# Patient Record
Sex: Male | Born: 1985 | Race: Black or African American | Hispanic: No | Marital: Single | Smoking: Current some day smoker
Health system: Southern US, Community
[De-identification: ages and names within clinical notes are randomized; demographics above are authoritative.]

## PROBLEM LIST (undated history)

## (undated) ENCOUNTER — Ambulatory Visit: Admission: EM

## (undated) ENCOUNTER — Ambulatory Visit

## (undated) DIAGNOSIS — T7840XA Allergy, unspecified, initial encounter: Secondary | ICD-10-CM

## (undated) DIAGNOSIS — K5190 Ulcerative colitis, unspecified, without complications: Secondary | ICD-10-CM

## (undated) DIAGNOSIS — I10 Essential (primary) hypertension: Secondary | ICD-10-CM

## (undated) HISTORY — DX: Allergy, unspecified, initial encounter: T78.40XA

---

## 2011-10-11 ENCOUNTER — Emergency Department (HOSPITAL_COMMUNITY)
Admission: EM | Admit: 2011-10-11 | Discharge: 2011-10-11 | Disposition: A | Discharge status: Home / Self Care | Payer: Self-pay | Source: Home / Self Care | Attending: Family Medicine | Admitting: Family Medicine

## 2011-10-11 ENCOUNTER — Encounter (HOSPITAL_COMMUNITY): Payer: Self-pay

## 2011-10-11 DIAGNOSIS — S43429A Sprain of unspecified rotator cuff capsule, initial encounter: Secondary | ICD-10-CM

## 2011-10-11 DIAGNOSIS — S46019A Strain of muscle(s) and tendon(s) of the rotator cuff of unspecified shoulder, initial encounter: Secondary | ICD-10-CM

## 2011-10-11 MED ORDER — NAPROXEN 500 MG PO TABS: 500.0000 mg | ORAL_TABLET | Freq: Two times a day (BID) | ORAL | Status: DC

## 2011-10-11 MED ORDER — HYDROCODONE-ACETAMINOPHEN 5-325 MG PO TABS: ORAL_TABLET | ORAL | Status: AC

## 2011-10-11 NOTE — Discharge Instructions (Signed)
Your exam is concerning for a rotator cuff tendinopathy, usually caused by repetitive or overuse activity. Take the medications as directed. Please call the Orthopaedic Provider listed on your discharge papers for the next available appointment as your shoulder might require further evaluation and/or treatment by a shoulder surgeon. Return to care should your symptoms not improve, or worsen in any way, such as numbness, tingling, or weakness, or pain worsens in any way.

## 2011-10-11 NOTE — ED Notes (Signed)
C/o 1 week duraiton of pain numbness left shoulder, worse w movement; denies trauma

## 2011-10-11 NOTE — ED Provider Notes (Signed)
History     CSN: [id number]  Arrival date & time 10/11/11  1608   First MD Initiated Contact with Patient 10/11/11 1722      Chief Complaint  Patient presents with  . Arm Pain    (Consider location/radiation/quality/duration/timing/severity/associated sxs/prior treatment) HPI Comments: Andre Gibbs presents for evaluation of left shoulder pain for the last week. He denies any specific injury, but does report, that he does a lot of lifting of heavy objects, for his job. 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 He denies any numbness, tingling, or weakness down his arm.  Patient is a 26 y.o. male presenting with shoulder pain. The history is provided by the patient.  Shoulder Pain This is a new problem. The current episode started more than 2 days ago. The problem occurs constantly. The problem has not changed since onset.The symptoms are aggravated by exertion. The symptoms are relieved by nothing. He has tried nothing for the symptoms.    History reviewed. No pertinent past medical history.  History reviewed. No pertinent past surgical history.  History reviewed. No pertinent family history.  History  Substance Use Topics  . Smoking status: Not on file  . Smokeless tobacco: Not on file  . Alcohol Use: Not on file      Review of Systems  Constitutional: Negative.   HENT: Negative.   Eyes: Negative.   Respiratory: Negative.   Cardiovascular: Negative.   Gastrointestinal: Negative.   Genitourinary: Negative.   Musculoskeletal: Positive for arthralgias.  Skin: Negative.   Neurological: Negative.     Allergies  Review of patient's allergies indicates no known allergies.  Home Medications   Current Outpatient Rx  Name Route Sig Dispense Refill  . HYDROCODONE-ACETAMINOPHEN 5-325 MG PO TABS  Take one to two tablets every 4 to 6 hours as needed for pain 20 tablet 0  . NAPROXEN 500 MG PO TABS Oral Take 1 tablet (500 mg total) by mouth 2 (two)  times daily. 30 tablet 0    BP 131/82  Pulse 59  Temp 98.2 F (36.8 C)  Resp 17  SpO2 98%  Physical Exam  Nursing note and vitals reviewed. Constitutional: He is oriented to person, place, and time. He appears well-developed and well-nourished.  HENT:  Head: Normocephalic and atraumatic.  Eyes: EOM are normal.  Neck: Normal range of motion.  Pulmonary/Chest: Effort normal.  Musculoskeletal:       Left shoulder: He exhibits decreased range of motion, tenderness and pain. He exhibits no bony tenderness, no swelling, no deformity, no laceration, normal pulse and normal strength.       LEFT shoulder: decreased abduction to 80 degrees, normal adduction, decreased flexion to 80 degrees, and decreased extension; 5/5 strength with internal and external rotation but pain elicited motion; 5/5 grip strength; positive Hawkin's, positive Ober's, negative Neer's, negative Speed's test  Neurological: He is alert and oriented to person, place, and time.  Skin: Skin is warm and dry.  Psychiatric: His behavior is normal.    ED Course  Procedures (including critical care time)  Labs Reviewed - No data to display No results found.   1. Rotator cuff strain       MDM  Given rx for naproxen and hydrocodone; follow up with Orthopaedic provider on call.        Renaee Munda, MD 10/11/11 [phone]

## 2011-12-06 ENCOUNTER — Emergency Department (HOSPITAL_COMMUNITY)
Admission: EM | Admit: 2011-12-06 | Discharge: 2011-12-06 | Disposition: A | Discharge status: Home / Self Care | Payer: Self-pay | Attending: Emergency Medicine | Admitting: Emergency Medicine

## 2011-12-06 ENCOUNTER — Encounter (HOSPITAL_COMMUNITY): Payer: Self-pay | Admitting: Emergency Medicine

## 2011-12-06 DIAGNOSIS — R110 Nausea: Secondary | ICD-10-CM | POA: Insufficient documentation

## 2011-12-06 DIAGNOSIS — R519 Headache, unspecified: Secondary | ICD-10-CM

## 2011-12-06 DIAGNOSIS — R51 Headache: Secondary | ICD-10-CM | POA: Insufficient documentation

## 2011-12-06 MED ORDER — NAPROXEN 500 MG PO TABS
500.0000 mg | ORAL_TABLET | Freq: Once | ORAL | Status: DC
Start: 2011-12-06 — End: 2011-12-06
  Filled 2011-12-06: qty 1

## 2011-12-06 MED ORDER — NAPROXEN 500 MG PO TABS: 500.0000 mg | ORAL_TABLET | Freq: Two times a day (BID) | ORAL | Status: DC

## 2011-12-06 NOTE — ED Notes (Signed)
PA at bedside.

## 2011-12-06 NOTE — ED Notes (Signed)
PT. REPORTS HEADACHE , DIZZINESS , CHILLS AND BODY ACHES ONSET LAST WEEK .

## 2011-12-06 NOTE — ED Provider Notes (Signed)
History     CSN: [id number]  Arrival date & time 12/06/11  0530   First MD Initiated Contact with Patient 12/06/11 0542      Chief Complaint  Patient presents with  . Headache    (Consider location/radiation/quality/duration/timing/severity/associated sxs/prior treatment) HPI Comments: Patient with no significant past medical history presents emergency department with chief complaint of headache.  Onset of symptoms began Thursday while at work.  Patient developed an acute onset of headache, dizziness, chills, blurred vision and diaphoresis.  Patient was sent home at that time and symptoms lasted for 3-4 hours and then resolved.  Patient was at baseline over the weekend and then developed symptoms again on Monday.  Headache has been intermittent, lasting hours, located primarily in the temporal and occipital regions, pain does not radiate, severity 8/10 at worst and now patient is pain-free. No hx of trauma, LOC, CP, SOB, persistent vision changes.   Patient is a 26 y.o. male presenting with headaches. The history is provided by the patient.  Headache  Associated symptoms include nausea. Pertinent negatives include no fever, no shortness of breath and no vomiting.    History reviewed. No pertinent past medical history.  History reviewed. No pertinent past surgical history.  No family history on file.  History  Substance Use Topics  . Smoking status: Current Everyday Smoker  . Smokeless tobacco: Not on file  . Alcohol Use: Yes      Review of Systems  Constitutional: Positive for chills, diaphoresis and activity change. Negative for fever and fatigue.  HENT: Negative for ear pain, congestion, facial swelling, neck pain, neck stiffness, sinus pressure and tinnitus.   Eyes: Negative for photophobia, redness and visual disturbance.  Respiratory: Negative for cough, shortness of breath, wheezing and stridor.   Cardiovascular: Negative for chest pain.  Gastrointestinal: Positive  for nausea. Negative for vomiting and abdominal pain.  Musculoskeletal: Negative for myalgias and gait problem.  Skin: Negative for rash.  Neurological: Positive for headaches. Negative for dizziness, syncope, speech difficulty, weakness, light-headedness and numbness.       No bowel or bladder incontinence.  Psychiatric/Behavioral: Negative for confusion.  All other systems reviewed and are negative.    Allergies  Review of patient's allergies indicates no known allergies.  Home Medications   Current Outpatient Rx  Name Route Sig Dispense Refill  . ASPIRIN-ACETAMINOPHEN-CAFFEINE 250-250-65 MG PO TABS Oral Take 1 tablet by mouth every 6 (six) hours as needed. For pain      BP 145/88  Pulse 82  Temp(Src) 100 F (37.8 C) (Oral)  Resp 20  SpO2 96%  Physical Exam  Nursing note and vitals reviewed. Constitutional: He is oriented to person, place, and time. He appears well-developed and well-nourished. No distress.  HENT:  Head: Normocephalic and atraumatic.  Right Ear: External ear normal.  Left Ear: External ear normal.  Eyes: Conjunctivae and EOM are normal. Pupils are equal, round, and reactive to light. Right eye exhibits no discharge. Left eye exhibits no discharge. Right conjunctiva is not injected. Right conjunctiva has no hemorrhage. Left conjunctiva is not injected. Left conjunctiva has no hemorrhage. No scleral icterus. Right eye exhibits no nystagmus. Left eye exhibits no nystagmus.  Neck: Normal range of motion and full passive range of motion without pain. Neck supple. No JVD present. No spinous process tenderness present. Carotid bruit is not present. No rigidity. No Brudzinski's sign noted.  Cardiovascular: Normal rate, regular rhythm, normal heart sounds and intact distal pulses.   Pulmonary/Chest: Effort normal and  breath sounds normal. No respiratory distress. He has no wheezes. He has no rales.  Musculoskeletal: Normal range of motion.  Lymphadenopathy:    He has  no cervical adenopathy.  Neurological: He is alert and oriented to person, place, and time. He has normal strength. No cranial nerve deficit or sensory deficit. He displays a negative Romberg sign. Coordination and gait normal. GCS eye subscore is 4. GCS verbal subscore is 5. GCS motor subscore is 6.       A&O x3.  Able to follow commands. PERRL, EOMs, no vertical or bidirectional nystagmus. Shoulder shrug, facial muscles, tongue protrusion and swallow intact.  Motor strength 5/5 bilaterally including grip strength, triceps, hamstrings and ankle dorsiflexion.  Normal patellar DTRs.  Light touch intact in all 4 distal limbs.  Intact finger to nose, shin to heel and rapid alternating movements. No ataxia or dysequilibrium.   Skin: Skin is warm and dry. No rash noted. He is not diaphoretic.  Psychiatric: He has a normal mood and affect. His behavior is normal.    ED Course  Procedures (including critical care time)  Labs Reviewed - No data to display No results found.   No diagnosis found.    MDM  Intermittent Headache  Pt pain free in ED.  Will treat w naprosyn 500 BID x 10days  Presentation non concerning for [facility], ICH, Meningitis, or temporal arteritis. Pt is afebrile with no focal neuro deficits, nuchal rigidity, or change in vision. Gait normal (toe and heal walking intact)  Pt is to follow up with PCP to discuss prophylactic medication. Pt verbalizes understanding and is agreeable with plan to dc.        Jaci Carrel, [place] 12/06/11 [phone]

## 2011-12-06 NOTE — Discharge Instructions (Signed)

## 2011-12-06 NOTE — ED Provider Notes (Signed)
Medical screening examination/treatment/procedure(s) were performed by non-physician practitioner and as supervising physician I was immediately available for consultation/collaboration.    Vida Roller, MD 12/06/11 [phone]

## 2011-12-06 NOTE — ED Notes (Signed)
Pt alert and oriented, with steady gait at time of discharge. Pt given discharge papers and papers explained. All questions answered and pt walked to discharge.  

## 2012-12-03 ENCOUNTER — Encounter (HOSPITAL_COMMUNITY): Payer: Self-pay | Admitting: *Deleted

## 2012-12-03 ENCOUNTER — Emergency Department (HOSPITAL_COMMUNITY)
Admission: EM | Admit: 2012-12-03 | Discharge: 2012-12-03 | Disposition: A | Discharge status: Home / Self Care | Payer: PRIVATE HEALTH INSURANCE | Attending: Emergency Medicine | Admitting: Emergency Medicine

## 2012-12-03 DIAGNOSIS — F17200 Nicotine dependence, unspecified, uncomplicated: Secondary | ICD-10-CM | POA: Insufficient documentation

## 2012-12-03 DIAGNOSIS — L0591 Pilonidal cyst without abscess: Secondary | ICD-10-CM

## 2012-12-03 DIAGNOSIS — L0501 Pilonidal cyst with abscess: Secondary | ICD-10-CM | POA: Insufficient documentation

## 2012-12-03 MED ORDER — IBUPROFEN 800 MG PO TABS
800.0000 mg | ORAL_TABLET | Freq: Once | ORAL | Status: AC
  Administered 2012-12-03: 800 mg via ORAL
  Filled 2012-12-03: qty 1

## 2012-12-03 NOTE — ED Notes (Signed)
Pt reports lump to back off and on x 1 year. Reports drainage, none noted at this time. Painful to touch.

## 2012-12-03 NOTE — ED Provider Notes (Signed)
History     CSN: [id number]  Arrival date & time 12/03/12  0716   First MD Initiated Contact with Patient 12/03/12 [phone]      Chief Complaint  Patient presents with  . Abscess    (Consider location/radiation/quality/duration/timing/severity/associated sxs/prior treatment) Patient is a 27 y.o. male presenting with abscess.  Abscess Abscess location: gluteal crease, right  Size:  2 cm Abscess quality: painful   Abscess quality: not draining, no fluctuance, no induration, no redness, no warmth and not weeping   Red streaking: no   Duration: waxing and waning for over a year. Painful these last few days. Progression:  Worsening Pain details:    Quality:  Sharp   Severity:  Moderate   Timing:  Intermittent   Progression:  Waxing and waning Chronicity:  Recurrent Context: not diabetes, not immunosuppression, not injected drug use, not insect bite/sting and not skin injury   Relieved by:  None tried Exacerbated by: sitting. Associated symptoms: no anorexia, no fatigue, no fever, no headaches, no nausea and no vomiting   Risk factors: prior abscess     History reviewed. No pertinent past medical history.  History reviewed. No pertinent past surgical history.  No family history on file.  History  Substance Use Topics  . Smoking status: Current Every Day Smoker  . Smokeless tobacco: Not on file  . Alcohol Use: Yes      Review of Systems  Constitutional: Negative for fever, diaphoresis and fatigue.  HENT: Negative for neck pain and neck stiffness.   Eyes: Negative for visual disturbance.  Respiratory: Negative for apnea, chest tightness and shortness of breath.   Cardiovascular: Negative for chest pain and palpitations.  Gastrointestinal: Negative for nausea, vomiting, diarrhea, constipation and anorexia.  Genitourinary: Negative for dysuria.  Musculoskeletal: Negative for gait problem.  Skin:       Mass at gluteal crease that comes and goes for over a year, painful  over the past few days.   Neurological: Negative for dizziness, weakness, light-headedness, numbness and headaches.    Allergies  Review of patient's allergies indicates no known allergies.  Home Medications  No current outpatient prescriptions on file.  BP 134/73  Pulse 86  Temp(Src) 98.3 F (36.8 C) (Oral)  Resp 14  SpO2 97%  Physical Exam  Nursing note and vitals reviewed. Constitutional: He is oriented to person, place, and time. He appears well-developed and well-nourished. No distress.  HENT:  Head: Normocephalic and atraumatic.  Eyes: Conjunctivae and EOM are normal.  Neck: Normal range of motion. Neck supple.  No meningeal signs  Cardiovascular: Normal rate, regular rhythm and normal heart sounds.  Exam reveals no gallop and no friction rub.   No murmur heard. Pulmonary/Chest: Effort normal and breath sounds normal. No respiratory distress. He has no wheezes. He has no rales. He exhibits no tenderness.  Abdominal: Soft. Bowel sounds are normal. He exhibits no distension. There is no tenderness. There is no rebound and no guarding.  Musculoskeletal: Normal range of motion. He exhibits no edema and no tenderness.  Neurological: He is alert and oriented to person, place, and time. No cranial nerve deficit.  Skin: Skin is warm and dry. He is not diaphoretic. No erythema.  Pilonidal cyst to the right of gluteal fold. No erythema, no warmth. Ultrasound reveals no hyperechoic fluid-filled areas.   Psychiatric: He has a normal mood and affect.    ED Course  Procedures (including critical care time)  Labs Reviewed - No data to display No results  found.   1. Pilonidal cyst without abscess       MDM  Tender non-fluctuant nodule located along the superior gluetal fold. Recurrent problem for this pt. No erythema, no warmth, no signs of cellulitis and does not appear amenable to drainage at this time. Ultrasound reveals no hyperechoic fluid-filled areas. Directed pt to take  sitz baths, apply warm compresses and to remain vigilant for any change in appearance or increase in pain. Discussed reasons to seek immediate care. Patient expresses understanding and agrees with plan.   Glade Nurse, PA-C 12/03/12 1115

## 2012-12-05 NOTE — ED Provider Notes (Signed)
Medical screening examination/treatment/procedure(s) were performed by non-physician practitioner and as supervising physician I was immediately available for consultation/collaboration.   Nelia Shi, MD 12/05/12 [phone]

## 2013-01-02 ENCOUNTER — Encounter (HOSPITAL_COMMUNITY): Payer: Self-pay | Admitting: Emergency Medicine

## 2013-01-02 ENCOUNTER — Emergency Department (HOSPITAL_COMMUNITY)
Admission: EM | Admit: 2013-01-02 | Discharge: 2013-01-02 | Disposition: A | Discharge status: Home / Self Care | Payer: PRIVATE HEALTH INSURANCE | Attending: Emergency Medicine | Admitting: Emergency Medicine

## 2013-01-02 DIAGNOSIS — X503XXA Overexertion from repetitive movements, initial encounter: Secondary | ICD-10-CM | POA: Insufficient documentation

## 2013-01-02 DIAGNOSIS — Y9389 Activity, other specified: Secondary | ICD-10-CM | POA: Insufficient documentation

## 2013-01-02 DIAGNOSIS — X500XXA Overexertion from strenuous movement or load, initial encounter: Secondary | ICD-10-CM | POA: Insufficient documentation

## 2013-01-02 DIAGNOSIS — Y990 Civilian activity done for income or pay: Secondary | ICD-10-CM | POA: Insufficient documentation

## 2013-01-02 DIAGNOSIS — S335XXA Sprain of ligaments of lumbar spine, initial encounter: Secondary | ICD-10-CM | POA: Insufficient documentation

## 2013-01-02 DIAGNOSIS — F17200 Nicotine dependence, unspecified, uncomplicated: Secondary | ICD-10-CM | POA: Insufficient documentation

## 2013-01-02 DIAGNOSIS — S39012A Strain of muscle, fascia and tendon of lower back, initial encounter: Secondary | ICD-10-CM

## 2013-01-02 DIAGNOSIS — Y9289 Other specified places as the place of occurrence of the external cause: Secondary | ICD-10-CM | POA: Insufficient documentation

## 2013-01-02 MED ORDER — CYCLOBENZAPRINE HCL 10 MG PO TABS: 10.0000 mg | ORAL_TABLET | Freq: Two times a day (BID) | ORAL | Status: DC | PRN

## 2013-01-02 NOTE — ED Provider Notes (Signed)
I saw and evaluated the patient, reviewed the resident's note and I agree with the findings and plan.   .Face to face Exam:  General:  Awake HEENT:  Atraumatic Resp:  Normal effort Abd:  Nondistended Neuro:No focal weakness   Nelia Shi, MD 01/02/13 [phone]

## 2013-01-02 NOTE — ED Provider Notes (Signed)
History     CSN: [id number]  Arrival date & time 01/02/13  0700   First MD Initiated Contact with Patient 01/02/13 [phone]      Chief Complaint  Patient presents with  . Back Pain    (Consider location/radiation/quality/duration/timing/severity/associated sxs/prior treatment) Patient is a 27 y.o. male presenting with back pain.  Back Pain Location:  Lumbar spine Quality:  Aching (sharp) Radiates to:  Does not radiate Pain severity:  Mild Worse during: worst at work. Onset quality:  Gradual Timing:  Intermittent Progression:  Waxing and waning Chronicity:  New Context: lifting heavy objects   Context: not falling, not MVA, not recent illness and not recent injury   Relieved by:  NSAIDs Worsened by:  Nothing tried Ineffective treatments:  None tried Associated symptoms: no abdominal pain, no chest pain, no dysuria, no fever, no headaches, no leg pain, no numbness, no perianal numbness, no tingling and no weakness     History reviewed. No pertinent past medical history.  History reviewed. No pertinent past surgical history.  No family history on file.  History  Substance Use Topics  . Smoking status: Current Every Day Smoker  . Smokeless tobacco: Not on file  . Alcohol Use: Yes      Review of Systems  Constitutional: Negative for fever and chills.  HENT: Negative for congestion, sore throat and rhinorrhea.   Eyes: Negative for photophobia and visual disturbance.  Respiratory: Negative for cough and shortness of breath.   Cardiovascular: Negative for chest pain and leg swelling.  Gastrointestinal: Negative for nausea, vomiting, abdominal pain, diarrhea and constipation.  Endocrine: Negative for polydipsia and polyuria.  Genitourinary: Negative for dysuria and hematuria.  Musculoskeletal: Positive for back pain. Negative for arthralgias.  Skin: Negative for color change and rash.  Neurological: Negative for dizziness, tingling, syncope, weakness, light-headedness,  numbness and headaches.  Hematological: Negative for adenopathy. Does not bruise/bleed easily.  All other systems reviewed and are negative.    Allergies  Review of patient's allergies indicates no known allergies.  Home Medications   Current Outpatient Rx  Name  Route  Sig  Dispense  Refill  . Naproxen Sodium (ALEVE PO)   Oral   Take 1-2 tablets by mouth every 12 (twelve) hours as needed (back pain).         . cyclobenzaprine (FLEXERIL) 10 MG tablet   Oral   Take 1 tablet (10 mg total) by mouth 2 (two) times daily as needed for muscle spasms.   20 tablet   0     BP 126/84  Pulse 67  Temp(Src) 98.4 F (36.9 C) (Oral)  Resp 18  SpO2 97%  Physical Exam  Vitals reviewed. Constitutional: He is oriented to person, place, and time. He appears well-developed and well-nourished.  HENT:  Head: Normocephalic and atraumatic.  Eyes: Conjunctivae and EOM are normal.  Neck: Normal range of motion. Neck supple.  Cardiovascular: Normal rate, regular rhythm and normal heart sounds.   Pulmonary/Chest: Effort normal and breath sounds normal. No respiratory distress.  Abdominal: He exhibits no distension. There is no tenderness. There is no rebound and no guarding.  Musculoskeletal: Normal range of motion.       Cervical back: Normal.       Thoracic back: Normal.       Lumbar back: He exhibits tenderness. He exhibits normal range of motion and no bony tenderness.       Back:  Neurological: He is alert and oriented to person, place, and time.  Skin: Skin is warm and dry.    ED Course  Procedures (including critical care time)  Labs Reviewed - No data to display No results found.   1. Back strain, initial encounter       MDM  27 y.o. male  with pertinent PMH of none presents with R paraspinal back pain x 4 days.  No fevers, numbness, infectious signs, or signs of cauda equina.  Physical exam as above with isolated muscular pain.  Likely back sprain, doubt fracture without  midline tenderness or trauma, and no signs of other process.  Discussed precautions, likely course.    Labs and imaging as above reviewed by myself and attending,Dr. Radford Pax, with whom case was discussed.   1. Back strain, initial encounter             Noel Gerold, MD 01/02/13 [phone]

## 2013-01-02 NOTE — ED Notes (Signed)
PT. REPORTS PROGRESSING RIGHT LOW BACK PAIN FOR SEVERAL DAYS TEMPORARY RELIEF WHILE USING OTC ALEEVE , PT. 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 AMBULATORY.

## 2013-07-01 ENCOUNTER — Encounter (HOSPITAL_COMMUNITY): Payer: Self-pay | Admitting: Emergency Medicine

## 2013-07-01 ENCOUNTER — Emergency Department (HOSPITAL_COMMUNITY)
Admission: EM | Admit: 2013-07-01 | Discharge: 2013-07-01 | Discharge status: Against Medical Advice | Payer: PRIVATE HEALTH INSURANCE | Attending: Emergency Medicine | Admitting: Emergency Medicine

## 2013-07-01 ENCOUNTER — Emergency Department (HOSPITAL_COMMUNITY)
Admission: EM | Admit: 2013-07-01 | Discharge: 2013-07-01 | Disposition: A | Discharge status: Home / Self Care | Payer: PRIVATE HEALTH INSURANCE | Attending: Emergency Medicine | Admitting: Emergency Medicine

## 2013-07-01 DIAGNOSIS — S0993XA Unspecified injury of face, initial encounter: Secondary | ICD-10-CM

## 2013-07-01 DIAGNOSIS — X58XXXA Exposure to other specified factors, initial encounter: Secondary | ICD-10-CM | POA: Insufficient documentation

## 2013-07-01 DIAGNOSIS — Z87891 Personal history of nicotine dependence: Secondary | ICD-10-CM | POA: Insufficient documentation

## 2013-07-01 DIAGNOSIS — Y929 Unspecified place or not applicable: Secondary | ICD-10-CM | POA: Insufficient documentation

## 2013-07-01 DIAGNOSIS — S025XXA Fracture of tooth (traumatic), initial encounter for closed fracture: Secondary | ICD-10-CM | POA: Insufficient documentation

## 2013-07-01 DIAGNOSIS — Y9389 Activity, other specified: Secondary | ICD-10-CM | POA: Insufficient documentation

## 2013-07-01 MED ORDER — PENICILLIN V POTASSIUM 500 MG PO TABS: 500.0000 mg | ORAL_TABLET | Freq: Three times a day (TID) | ORAL | Status: DC

## 2013-07-01 MED ORDER — OXYCODONE-ACETAMINOPHEN 5-325 MG PO TABS
1.0000 | ORAL_TABLET | Freq: Once | ORAL | Status: AC
  Administered 2013-07-01: 1 via ORAL
  Filled 2013-07-01: qty 1

## 2013-07-01 MED ORDER — HYDROCODONE-ACETAMINOPHEN 5-325 MG PO TABS: 1.0000 | ORAL_TABLET | ORAL | Status: DC | PRN

## 2013-07-01 NOTE — ED Provider Notes (Signed)
CSN: [id number]     Arrival date & time 07/01/13  6213 History  This chart was scribed for non-physician practitioner Mellody Drown, PA-C working with Roney Marion, MD by Joaquin Music, ED Scribe. This patient was seen in room TR10C/TR10C and the patient's care was started at 9:47 PM .   Chief Complaint  Patient presents with  . Dental Pain   The history is provided by the patient. No language interpreter was used.   HPI Comments: Andre Gibbs is a 27 y.o. male who presents to the Emergency Department complaining of ongoing worsening upper R dental pain with associated HA that began 4 days ago. Pt reports opening a bottle with his mouth when his tooth broke. He states the tooth was bleeding slightly when injury occurred but denies any other discharge. Pt denies taking any OTC medications. Pt denies having a dentist. Pt denies fever and chills.   History reviewed. No pertinent past medical history. History reviewed. No pertinent past surgical history. History reviewed. No pertinent family history. History  Substance Use Topics  . Smoking status: Former Smoker    Quit date: 03/01/2013  . Smokeless tobacco: Not on file  . Alcohol Use: Yes    Review of Systems  Constitutional: Negative for fever and chills.  HENT: Negative for facial swelling, mouth sores and sinus pressure.     Allergies  Review of patient's allergies indicates no known allergies.  Home Medications  No current outpatient prescriptions on file.  Triage Vitals:BP 143/108  Pulse 67  Temp(Src) 99.1 F (37.3 C) (Oral)  Resp 16  Ht 6\' 4"  (1.93 m)  Wt 262 lb 12.8 oz (119.205 kg)  BMI 32.00 kg/m2  SpO2 97%  Physical Exam  Nursing note and vitals reviewed. Constitutional: He appears well-developed and well-nourished. No distress.  HENT:  Head: Normocephalic and atraumatic.  Mouth/Throat: Uvula is midline and oropharynx is clear and moist. Mucous membranes are not pale. No trismus in the jaw. Abnormal  dentition. Dental caries present. No dental abscesses. No oropharyngeal exudate, posterior oropharyngeal edema or posterior oropharyngeal erythema.    5th R upper tooth with the Facial/Buccal surface intact lingual, distal, mesial aspects of tooth missing; opposite 5th L upper missing. Poor dentition. Multiple caries noted.  No obvious area of fluctuance or erythema. No tenderness to palpation of the maxilla.   Eyes: Pupils are equal, round, and reactive to light.  Neck: Normal range of motion. Neck supple. No thyromegaly present.  Pulmonary/Chest: Effort normal.  Skin: Skin is warm.    ED Course  Procedures  DIAGNOSTIC STUDIES: Oxygen Saturation is 97% on RA, normal by my interpretation.    COORDINATION OF CARE: 9:50 PM-Discussed treatment plan which includes consulting with attending provider for further evaluation. Pt agreed to plan.   Labs Review Labs Reviewed - No data to display Imaging Review No results found.  EKG Interpretation   None       MDM   1. Tooth injury, initial encounter    Pt with a history of injury to tooth presents with tooth pain. Discussed patient history and condition with Dr. Fayrene Fearing who agrees the patient can be evaluated as an out-pt with a dentist. Dental referral given, resources given. Discussed treatment plan with the patient. Return precautions given. Reports understanding and no other concerns at this time.  Patient is stable for discharge at this time.  Meds given in ED:  Medications  oxyCODONE-acetaminophen (PERCOCET/ROXICET) 5-325 MG per tablet 1 tablet (1 tablet Oral Given 07/01/13 2058)  oxyCODONE-acetaminophen (PERCOCET/ROXICET) 5-325 MG per tablet 1 tablet (1 tablet Oral Given 07/01/13 2231)    Discharge Medication List as of 07/01/2013 10:15 PM    START taking these medications   Details  HYDROcodone-acetaminophen (NORCO/VICODIN) 5-325 MG per tablet Take 1 tablet by mouth every 4 (four) hours as needed., Starting 07/01/2013, Until  Discontinued, Print    penicillin v potassium (VEETID) 500 MG tablet Take 1 tablet (500 mg total) by mouth 3 (three) times daily., Starting 07/01/2013, Until Discontinued, Print        I personally performed the services described in this documentation, which was scribed in my presence. The recorded information has been reviewed and is accurate.    Clabe Seal, PA-C 07/03/13 2245

## 2013-07-01 NOTE — ED Notes (Signed)
Patient called and no answer.  No in the waiting room

## 2013-07-01 NOTE — ED Notes (Signed)
Pt c/o right upper dental pain x 4 days 

## 2013-07-01 NOTE — ED Notes (Signed)
Patient states he was trying to open a bottle and broke a tooth on the top right.

## 2013-07-01 NOTE — ED Notes (Signed)
No answer when called  Not in the waiting room 

## 2013-07-05 NOTE — ED Provider Notes (Signed)
Medical screening examination/treatment/procedure(s) were performed by non-physician practitioner and as supervising physician I was immediately available for consultation/collaboration.  EKG Interpretation   None         Josua Ferrebee J Charlsey Moragne, MD 07/05/13 0730 

## 2014-07-02 ENCOUNTER — Encounter (HOSPITAL_COMMUNITY): Payer: Self-pay | Admitting: *Deleted

## 2014-07-02 ENCOUNTER — Emergency Department (HOSPITAL_COMMUNITY)
Admission: EM | Admit: 2014-07-02 | Discharge: 2014-07-02 | Disposition: A | Discharge status: Home / Self Care | Payer: Commercial Managed Care - PPO | Attending: Emergency Medicine | Admitting: Emergency Medicine

## 2014-07-02 ENCOUNTER — Emergency Department (HOSPITAL_COMMUNITY): Payer: Commercial Managed Care - PPO

## 2014-07-02 DIAGNOSIS — Z87891 Personal history of nicotine dependence: Secondary | ICD-10-CM | POA: Diagnosis not present

## 2014-07-02 DIAGNOSIS — W1839XA Other fall on same level, initial encounter: Secondary | ICD-10-CM | POA: Diagnosis not present

## 2014-07-02 DIAGNOSIS — Y998 Other external cause status: Secondary | ICD-10-CM | POA: Diagnosis not present

## 2014-07-02 DIAGNOSIS — Z792 Long term (current) use of antibiotics: Secondary | ICD-10-CM | POA: Diagnosis not present

## 2014-07-02 DIAGNOSIS — S93402A Sprain of unspecified ligament of left ankle, initial encounter: Secondary | ICD-10-CM | POA: Insufficient documentation

## 2014-07-02 DIAGNOSIS — Y92321 Football field as the place of occurrence of the external cause: Secondary | ICD-10-CM | POA: Insufficient documentation

## 2014-07-02 DIAGNOSIS — T1490XA Injury, unspecified, initial encounter: Secondary | ICD-10-CM

## 2014-07-02 DIAGNOSIS — Y9369 Activity, other involving other sports and athletics played as a team or group: Secondary | ICD-10-CM | POA: Insufficient documentation

## 2014-07-02 DIAGNOSIS — Z79899 Other long term (current) drug therapy: Secondary | ICD-10-CM | POA: Insufficient documentation

## 2014-07-02 DIAGNOSIS — S99912A Unspecified injury of left ankle, initial encounter: Secondary | ICD-10-CM | POA: Diagnosis present

## 2014-07-02 MED ORDER — HYDROCODONE-ACETAMINOPHEN 5-325 MG PO TABS: 1.0000 | ORAL_TABLET | Freq: Four times a day (QID) | ORAL | Status: DC | PRN

## 2014-07-02 MED ORDER — IBUPROFEN 800 MG PO TABS: 800.0000 mg | ORAL_TABLET | Freq: Three times a day (TID) | ORAL | Status: DC

## 2014-07-02 NOTE — ED Notes (Signed)
Pt states was playing football today around lunch and twisted L ankle, states was then hopping around on foot when he fell on it worse, states having swelling in L ankle and a lot of pain.

## 2014-07-02 NOTE — Discharge Instructions (Signed)
Call an orthopedic specialist for further evaluation of your ankle injury. Wear ankle brace for support, elevate above your heart while you are not walking or standing to reduce swelling. Use crutches as needed to reduce pain with walking. Call for a follow up appointment with a Family or Primary Care Provider.  Return if Symptoms worsen.   Take medication as prescribed. Do not operate heavy machinery or drink alcohol taking narcotic pain medication

## 2014-07-02 NOTE — ED Provider Notes (Signed)
CSN: [id number]     Arrival date & time 07/02/14  2047 History   First MD Initiated Contact with Patient 07/02/14 2136    This chart was scribed for non-physician practitioner, Mellody DrownLauren Adebayo Ensminger, PA working with Ethelda ChickMartha K Linker, MD by Marica OtterNusrat Rahman, ED Scribe. This patient was seen in room WTR5/WTR5 and the patient's care was started at 10:10 PM.  Chief Complaint  Patient presents with  . Ankle Pain    left   HPI Comments: Andre Gibbs is a 28 y.o. male who presents to the Emergency Department complaining of traumatic, sudden onset, constant, acute, worsening left ankle pain with associated swelling onset earlier today when pt first twisted his left ankle while playing football and subsequently fell on his left foot. Pt rates his present pain a 10 out of 10.    The history is provided by the patient. No language interpreter was used.    History reviewed. No pertinent past medical history. History reviewed. No pertinent past surgical history. No family history on file. History  Substance Use Topics  . Smoking status: Former Smoker    Quit date: 03/01/2013  . Smokeless tobacco: Not on file  . Alcohol Use: Yes    Review of Systems  Constitutional: Negative for fever and chills.  Musculoskeletal:       Left ankle pain and swelling  Psychiatric/Behavioral: Negative for confusion.   Allergies  Review of patient's allergies indicates no known allergies.  Home Medications   Prior to Admission medications   Medication Sig Start Date End Date Taking? Authorizing Provider  HYDROcodone-acetaminophen (NORCO/VICODIN) 5-325 MG per tablet Take 1 tablet by mouth every 4 (four) hours as needed. 07/01/13   Mellody DrownLauren Rupa Lagan, PA-C  penicillin v potassium (VEETID) 500 MG tablet Take 1 tablet (500 mg total) by mouth 3 (three) times daily. 07/01/13   Mellody DrownLauren Heitor Steinhoff, PA-C   Triage Vitals: BP 112/92 mmHg  Pulse 86  Temp(Src) 98.2 F (36.8 C) (Oral)  Resp 18  Ht 6\' 4"  (1.93 m)  Wt 266 lb (120.657 kg)   BMI 32.39 kg/m2  SpO2 98% Physical Exam  Constitutional: He is oriented to person, place, and time. He appears well-developed and well-nourished. No distress.  HENT:  Head: Normocephalic and atraumatic.  Eyes: Conjunctivae and EOM are normal.  Neck: Neck supple.  Pulmonary/Chest: Effort normal. No respiratory distress.  Musculoskeletal: Normal range of motion.       Left ankle: He exhibits swelling. He exhibits normal range of motion and no deformity. Tenderness. Lateral malleolus and AITFL tenderness found. Achilles tendon exhibits normal Thompson's test results.  Neurological: He is alert and oriented to person, place, and time.  Skin: Skin is warm and dry.  Psychiatric: He has a normal mood and affect. His behavior is normal.  Nursing note and vitals reviewed.   ED Course  Procedures (including critical care time) 10:12 PM-Discussed treatment plan which includes discussing imaging results, crutches, ortho referral, keeping leg elevated, meds, and work note with pt at bedside and pt agreed to plan.   Labs Review Labs Reviewed - No data to display  Imaging Review Dg Ankle Complete Left  07/02/2014   CLINICAL DATA:  Left ankle pain following football injury, initial encounter  EXAM: LEFT ANKLE COMPLETE - 3+ VIEW  COMPARISON:  None.  FINDINGS: Mild soft tissue swelling is noted laterally. No acute fracture or dislocation is seen. No other focal abnormality is noted.  IMPRESSION: Soft tissue swelling without acute bony abnormality.   Electronically Signed  By: Alcide CleverMark  Lukens M.D.   On: 07/02/2014 21:45     EKG Interpretation None      MDM   Final diagnoses:  Ankle sprain, left, initial encounter   I personally performed the services described in this documentation, which was scribed in my presence. The recorded information has been reviewed and is accurate.    Mellody DrownLauren Arnell Mausolf, PA-C 07/03/14 [id number]  Ethelda ChickMartha K Linker, MD 07/03/14 [phone]

## 2015-01-25 ENCOUNTER — Emergency Department (HOSPITAL_COMMUNITY)
Admission: EM | Admit: 2015-01-25 | Discharge: 2015-01-25 | Disposition: A | Discharge status: Home / Self Care | Payer: Commercial Managed Care - PPO | Attending: Emergency Medicine | Admitting: Emergency Medicine

## 2015-01-25 ENCOUNTER — Encounter (HOSPITAL_COMMUNITY): Payer: Self-pay | Admitting: *Deleted

## 2015-01-25 DIAGNOSIS — Z720 Tobacco use: Secondary | ICD-10-CM | POA: Diagnosis not present

## 2015-01-25 DIAGNOSIS — L0501 Pilonidal cyst with abscess: Secondary | ICD-10-CM | POA: Diagnosis not present

## 2015-01-25 DIAGNOSIS — M549 Dorsalgia, unspecified: Secondary | ICD-10-CM | POA: Diagnosis present

## 2015-01-25 DIAGNOSIS — Z791 Long term (current) use of non-steroidal anti-inflammatories (NSAID): Secondary | ICD-10-CM | POA: Diagnosis not present

## 2015-01-25 DIAGNOSIS — Z792 Long term (current) use of antibiotics: Secondary | ICD-10-CM | POA: Diagnosis not present

## 2015-01-25 MED ORDER — ONDANSETRON 4 MG PO TBDP
8.0000 mg | ORAL_TABLET | Freq: Once | ORAL | Status: AC
  Administered 2015-01-25: 8 mg via ORAL
  Filled 2015-01-25: qty 2

## 2015-01-25 MED ORDER — LIDOCAINE-EPINEPHRINE (PF) 2 %-1:200000 IJ SOLN
20.0000 mL | Freq: Once | INTRAMUSCULAR | Status: AC
  Administered 2015-01-25: 20 mL
  Filled 2015-01-25: qty 20

## 2015-01-25 MED ORDER — OXYCODONE-ACETAMINOPHEN 5-325 MG PO TABS
1.0000 | ORAL_TABLET | Freq: Once | ORAL | Status: AC
Start: 2015-01-25 — End: 2015-01-25
  Administered 2015-01-25: 1 via ORAL
  Filled 2015-01-25: qty 1

## 2015-01-25 MED ORDER — SULFAMETHOXAZOLE-TRIMETHOPRIM 800-160 MG PO TABS: 1.0000 | ORAL_TABLET | Freq: Two times a day (BID) | ORAL | Status: AC

## 2015-01-25 NOTE — ED Provider Notes (Addendum)
CSN: [id number]     Arrival date & time 01/25/15  0600 History   First MD Initiated Contact with Patient 01/25/15 [phone]     Chief Complaint  Patient presents with  . Back Pain      HPI Patient presents emergency department can complaining of increasing lower back pain and really swelling over his superior natal cleft.  No fevers or chills.  Pain is worse with palpation sitting.  History of abscess.  Usually resolves with warm compresses.  He tried this without improvement in his symptoms.   History reviewed. No pertinent past medical history. History reviewed. No pertinent past surgical history. No family history on file. History  Substance Use Topics  . Smoking status: Current Every Day Smoker    Last Attempt to Quit: 03/01/2013  . Smokeless tobacco: Not on file  . Alcohol Use: Yes    Review of Systems  All other systems reviewed and are negative.     Allergies  Review of patient's allergies indicates no known allergies.  Home Medications   Prior to Admission medications   Medication Sig Start Date End Date Taking? Authorizing Provider  HYDROcodone-acetaminophen (NORCO/VICODIN) 5-325 MG per tablet Take 1-2 tablets by mouth every 6 (six) hours as needed for moderate pain or severe pain. 07/02/14   Mellody DrownLauren Parker, PA-C  ibuprofen (ADVIL,MOTRIN) 800 MG tablet Take 1 tablet (800 mg total) by mouth 3 (three) times daily. 07/02/14   Mellody DrownLauren Parker, PA-C  penicillin v potassium (VEETID) 500 MG tablet Take 1 tablet (500 mg total) by mouth 3 (three) times daily. 07/01/13   Mellody DrownLauren Parker, PA-C  sulfamethoxazole-trimethoprim (BACTRIM DS,SEPTRA DS) 800-160 MG per tablet Take 1 tablet by mouth 2 (two) times daily. 01/25/15 02/01/15  Azalia BilisKevin Dunbar Buras, MD   BP 124/76 mmHg  Pulse 91  Temp(Src) 99.7 F (37.6 C) (Oral)  Resp 22  SpO2 93% Physical Exam  Constitutional: He is oriented to person, place, and time. He appears well-developed and well-nourished.  HENT:  Head: Normocephalic and  atraumatic.  Eyes: EOM are normal.  Neck: Normal range of motion.  Cardiovascular: Normal rate and regular rhythm.   Pulmonary/Chest: Effort normal.  Abdominal: Soft.  Musculoskeletal: Normal range of motion.  Swelling and fluctuance without drainage over the superior natal cleft consistent with pilonidal cyst  Neurological: He is alert and oriented to person, place, and time.  Skin: Skin is warm and dry.  Psychiatric: He has a normal mood and affect. Judgment normal.  Nursing note and vitals reviewed.   ED Course  Procedures (including critical care time)  INCISION AND DRAINAGE Performed by: Lyanne CoAMPOS,Maxemiliano Riel M Consent: Verbal consent obtained. Risks and benefits: risks, benefits and alternatives were discussed Time out performed prior to procedure Type: abscess Body area: superior natal cleft Anesthesia: local infiltration Incision was made with a scalpel. Local anesthetic: lidocaine 2% with epinephrine Anesthetic total: 8 ml Complexity: complex Blunt dissection to break up loculations Drainage: purulent Drainage amount: large Packing material: none Patient tolerance: Patient tolerated the procedure well with no immediate complications.     Labs Review Labs Reviewed - No data to display  Imaging Review No results found.   EKG Interpretation None      MDM   Final diagnoses:  Pilonidal abscess    7:57 AM Patient tolerated the procedure well.  Home with Bactrim.  Patient understands return to the ER for new or worsening symptoms.    Azalia BilisKevin Aldea Avis, MD 01/25/15 [id number]  Azalia BilisKevin Ronson Hagins, MD 01/25/15 [phone]

## 2015-01-25 NOTE — ED Notes (Signed)
Incision to buttock cleaned and dressing applied.

## 2015-01-25 NOTE — ED Notes (Signed)
The pt  Is c/o lower back pain for 4 days this time.  He has had back problems for a long time.  No new injury

## 2015-01-25 NOTE — Discharge Instructions (Signed)

## 2017-03-29 ENCOUNTER — Encounter: Payer: Self-pay | Admitting: Medical

## 2017-03-29 ENCOUNTER — Ambulatory Visit (INDEPENDENT_AMBULATORY_CARE_PROVIDER_SITE_OTHER): Payer: Commercial Managed Care - PPO | Admitting: Medical

## 2017-03-29 VITALS — BP 134/80 | HR 67 | Ht 75.0 in | Wt 271.8 lb

## 2017-03-29 DIAGNOSIS — Z113 Encounter for screening for infections with a predominantly sexual mode of transmission: Secondary | ICD-10-CM | POA: Diagnosis not present

## 2017-03-29 DIAGNOSIS — Z0000 Encounter for general adult medical examination without abnormal findings: Secondary | ICD-10-CM | POA: Diagnosis not present

## 2017-03-29 DIAGNOSIS — Z23 Encounter for immunization: Secondary | ICD-10-CM | POA: Diagnosis not present

## 2017-03-29 DIAGNOSIS — N478 Other disorders of prepuce: Secondary | ICD-10-CM | POA: Insufficient documentation

## 2017-03-29 DIAGNOSIS — R369 Urethral discharge, unspecified: Secondary | ICD-10-CM | POA: Diagnosis not present

## 2017-03-29 DIAGNOSIS — K529 Noninfective gastroenteritis and colitis, unspecified: Secondary | ICD-10-CM | POA: Diagnosis not present

## 2017-03-29 DIAGNOSIS — F419 Anxiety disorder, unspecified: Secondary | ICD-10-CM | POA: Diagnosis not present

## 2017-03-29 LAB — POCT URINALYSIS DIP (PROADVANTAGE DEVICE)
BILIRUBIN UA: NEGATIVE
GLUCOSE UA: NEGATIVE mg/dL
Ketones, POC UA: NEGATIVE mg/dL
LEUKOCYTES UA: NEGATIVE
Nitrite, UA: NEGATIVE
PH UA: 7 (ref 5.0–8.0)
Protein Ur, POC: NEGATIVE mg/dL
RBC UA: NEGATIVE
Specific Gravity, Urine: 1.02
Urobilinogen, Ur: POSITIVE

## 2017-03-29 NOTE — Progress Notes (Signed)
Subjective:   HPI  Andre Gibbs is a 31 y.o. male who presents for new patient physical Chief Complaint  Patient presents with  . Annual Exam    physical, discharge     Medical care team includes: Tysinger, Kermit Balo, PA-C here for primary care Dentist Eye doctor  Concerns: Born in [place].  Been at same job here in [place] 4 years, has 3yo son who he has custody over every other week, sharing time with mother of his child.  He reports anxiety problems and panic attacks for years.   Prior anxiety came from grandmother's passing, her losing her home and moving from [place].   He worries about things, gets overwhelmed sometimes.  No prior counseling or treatment for anxiety.   He notes hx/o several years of recurrent diarrhea, sometimes solid stools.   Has BM daily multiple times daily , 3-4 ties daily.  Tried to stop dairy, that helped some.   No constipation, no blood in stool.  No prior GI consult, allergy testing, or medication.     He notes hx/o migraines in the past, but not much problems recently.  Has trouble sleeping at times.  Wants STD screen given new girlfriend and recent whitish discharge, some irritation with urination, but no testicular pain or swelling, no blood in urine or semen.   No rash.   No prior STD.   Last testing 2 years ago.     Reviewed their medical, surgical, family, social, medication, and allergy history and updated chart as appropriate.  Past Medical History:  Diagnosis Date  . Allergy     No past surgical history on file.  Social History   Social History  . Marital status: Single    Spouse name: N/A  . Number of children: N/A  . Years of education: N/A   Occupational History  . Not on file.   Social History Main Topics  . Smoking status: Current Some Day Smoker    Packs/day: 10.00    Last attempt to quit: 03/01/2013  . Smokeless tobacco: Never Used  . Alcohol use 1.2 oz/week    1 Cans of beer, 1 Shots of liquor per week  . Drug use: Yes     Types: Marijuana  . Sexual activity: Yes   Other Topics Concern  . Not on file   Social History Narrative   Has a girlfriend, 3yo son, works in Teaching laboratory technician and receiving.   Physical activity on the job, but also exercising.  Plays paintball regularly.   03/2017    Family History  Problem Relation Age of Onset  . Stroke Mother        x3  . Hypertension Mother   . Other Father        history unknown  . Hypertension Maternal Grandmother   . Heart disease Maternal Grandmother   . Diabetes Maternal Grandmother   . Cancer Neg Hx      Current Outpatient Prescriptions:  .  ibuprofen (ADVIL,MOTRIN) 800 MG tablet, Take 1 tablet (800 mg total) by mouth 3 (three) times daily., Disp: 21 tablet, Rfl: 0  No Known Allergies   Review of Systems Constitutional: -fever, -chills, -sweats, -unexpected weight change, -decreased appetite, -fatigue Allergy: -sneezing, -itching, -congestion Dermatology: -changing moles, --rash, -lumps ENT: -runny nose, -ear pain, -sore throat, -hoarseness, -sinus pain, -teeth pain, - ringing in ears, -hearing loss, -nosebleeds Cardiology: -chest pain, -palpitations, -swelling, -difficulty breathing when lying flat, -waking up short of breath Respiratory: -cough, -shortness of breath, -difficulty breathing  with exercise or exertion, -wheezing, -coughing up blood Gastroenterology: -abdominal pain, -nausea, -vomiting, +diarrhea, -constipation, -blood in stool, -changes in bowel movement, -difficulty swallowing or eating Hematology: -bleeding, -bruising  Musculoskeletal: -joint aches, -muscle aches, -joint swelling, -back pain, -neck pain, -cramping, -changes in gait Ophthalmology: denies vision changes, eye redness, itching, discharge Urology: -burning with urination, -difficulty urinating, -blood in urine, -urinary frequency, -urgency, -incontinence Neurology: -headache, -weakness, -tingling, -numbness, -memory loss, -falls, -dizziness Psychology: +occasional depressed  mood, -SI/HI, +anxiety, -agitation, +sleep problems     Objective:   BP 134/80   Pulse 67   Ht 6\' 3"  (1.905 m)   Wt 271 lb 12.8 oz (123.3 kg)   SpO2 96%   BMI 33.97 kg/m   General appearance: alert, no distress, WD/WN, African American male Skin: unremarkable HEENT: normocephalic, conjunctiva/corneas normal, sclerae anicteric, PERRLA, EOMi, nares patent, no discharge or erythema, pharynx normal Oral cavity: MMM, tongue normal, teeth normal Neck: supple, no lymphadenopathy, no thyromegaly, no masses, normal ROM, no bruits Chest: non tender, normal shape and expansion Heart: RRR, normal S1, S2, no murmurs Lungs: CTA bilaterally, no wheezes, rhonchi, or rales Abdomen: +bs, soft, mild generalized tenderness,  non distended, no masses, no hepatomegaly, no splenomegaly, no bruits Back: non tender, normal ROM, no scoliosis Musculoskeletal: upper extremities non tender, no obvious deformity, normal ROM throughout, lower extremities non tender, no obvious deformity, normal ROM throughout Extremities: no edema, no cyanosis, no clubbing Pulses: 2+ symmetric, upper and lower extremities, normal cap refill Neurological: alert, oriented x 3, CN2-12 intact, strength normal upper extremities and lower extremities, sensation normal throughout, DTRs 2+ throughout, no cerebellar signs, gait normal Psychiatric: normal affect, behavior normal, pleasant  GU: normal male external genitalia,uncircumcised, unable to fully retract foreskin, nontender, no masses, no hernia, no lymphadenopathy Rectal: deferred   Assessment and Plan :    Encounter Diagnoses  Name Primary?  . Encounter for health maintenance examination in adult Yes  . Chronic diarrhea   . Need for Tdap vaccination   . Need for influenza vaccination   . Anxiety   . Screen for STD (sexually transmitted disease)   . Foreskin problem   . Penile discharge     Physical exam - discussed and counseled on healthy lifestyle, diet, exercise,  preventative care, vaccinations, sick and well care, proper use of emergency dept and after hours care, and addressed their concerns.    Health screening: See your eye doctor yearly for routine vision care. See your dentist yearly for routine dental care including hygiene visits twice yearly.  Discussed STD testing, discussed prevention, condom use, means of transmission  Cancer screening Discussed monthly testicular exam  Vaccinations: Counseled on the influenza virus vaccine.  Vaccine information sheet given.  Influenza vaccine given after consent obtained. Counseled on the Tdap (tetanus, diptheria, and acellular pertussis) vaccine.  Vaccine information sheet given. Tdap vaccine given after consent obtained.   Acute issues discussed: Penile discharge, labs today  Separate significant chronic issues discussed: Chronic diarrhea - discussed food diary, avoid known triggers, can use Imodium OTC.  F/u in 3-4 wk.  Anxiety - counseled on coping skills, recommended he see counseling.  F/u 3-4 wk.  Tight foreskin, unable to retract fully - refer to urology  Vivia BirminghamJalil was seen today for annual exam.  Diagnoses and all orders for this visit:  Encounter for health maintenance examination in adult -     Comprehensive metabolic panel -     CBC -     Lipid panel -  TSH -     HIV antibody -     RPR -     GC/Chlamydia Probe Amp -     POCT Urinalysis DIP (Proadvantage Device)  Chronic diarrhea  Need for Tdap vaccination -     Tdap vaccine greater than or equal to 7yo IM  Need for influenza vaccination -     Flu Vaccine QUAD 36+ mos IM  Anxiety  Screen for STD (sexually transmitted disease) -     HIV antibody -     RPR -     GC/Chlamydia Probe Amp  Foreskin problem -     Ambulatory referral to Urology  Penile discharge  Other orders -     GC Probe Amp, ThinPrep  Follow-up pending labs, yearly for physical

## 2017-03-31 LAB — COMPREHENSIVE METABOLIC PANEL
AG Ratio: 1.4 (calc) (ref 1.0–2.5)
ALT: 27 U/L (ref 9–46)
AST: 22 U/L (ref 10–40)
Albumin: 4.1 g/dL (ref 3.6–5.1)
Alkaline phosphatase (APISO): 65 U/L (ref 40–115)
BILIRUBIN TOTAL: 0.3 mg/dL (ref 0.2–1.2)
BUN: 13 mg/dL (ref 7–25)
CALCIUM: 9 mg/dL (ref 8.6–10.3)
CO2: 27 mmol/L (ref 20–32)
Chloride: 106 mmol/L (ref 98–110)
Creat: 0.87 mg/dL (ref 0.60–1.35)
GLUCOSE: 86 mg/dL (ref 65–99)
Globulin: 2.9 g/dL (calc) (ref 1.9–3.7)
Potassium: 4 mmol/L (ref 3.5–5.3)
Sodium: 140 mmol/L (ref 135–146)
Total Protein: 7 g/dL (ref 6.1–8.1)

## 2017-03-31 LAB — TEST AUTHORIZATION

## 2017-03-31 LAB — CBC
HCT: 40.6 % (ref 38.5–50.0)
Hemoglobin: 13.8 g/dL (ref 13.2–17.1)
MCH: 29.8 pg (ref 27.0–33.0)
MCHC: 34 g/dL (ref 32.0–36.0)
MCV: 87.7 fL (ref 80.0–100.0)
MPV: 10.3 fL (ref 7.5–12.5)
PLATELETS: 268 10*3/uL (ref 140–400)
RBC: 4.63 10*6/uL (ref 4.20–5.80)
RDW: 11.6 % (ref 11.0–15.0)
WBC: 7.9 10*3/uL (ref 3.8–10.8)

## 2017-03-31 LAB — LIPID PANEL
Cholesterol: 143 mg/dL (ref <200)
HDL: 47 mg/dL (ref >40)
LDL CHOLESTEROL (CALC): 74 mg/dL
Non-HDL Cholesterol (Calc): 96 mg/dL (calc) (ref <130)
TRIGLYCERIDES: 134 mg/dL (ref <150)
Total CHOL/HDL Ratio: 3 (calc) (ref <5.0)

## 2017-03-31 LAB — CHLAMYDIA PROBE AMP THINPREP: C. TRACHOMATIS RNA, TMA: NOT DETECTED

## 2017-03-31 LAB — RPR: RPR Ser Ql: NONREACTIVE

## 2017-03-31 LAB — GC PROBE AMP THINPREP: N. gonorrhoeae RNA, TMA: NOT DETECTED

## 2017-03-31 LAB — HIV ANTIBODY (ROUTINE TESTING W REFLEX): HIV 1&2 Ab, 4th Generation: NONREACTIVE

## 2017-03-31 LAB — TSH: TSH: 0.46 m[IU]/L (ref 0.40–4.50)

## 2017-04-02 ENCOUNTER — Other Ambulatory Visit: Payer: Self-pay | Admitting: Medical

## 2017-04-02 MED ORDER — CIPROFLOXACIN HCL 500 MG PO TABS: 500.0000 mg | ORAL_TABLET | Freq: Two times a day (BID) | ORAL | 0 refills | Status: AC

## 2017-05-11 ENCOUNTER — Ambulatory Visit: Payer: Self-pay | Admitting: Medical

## 2017-05-17 ENCOUNTER — Encounter: Payer: Self-pay | Admitting: Medical

## 2017-05-22 ENCOUNTER — Emergency Department (HOSPITAL_COMMUNITY)
Admission: EM | Admit: 2017-05-22 | Discharge: 2017-05-22 | Disposition: A | Discharge status: Home / Self Care | Payer: Commercial Managed Care - PPO | Attending: Emergency Medicine | Admitting: Emergency Medicine

## 2017-05-22 ENCOUNTER — Encounter (HOSPITAL_COMMUNITY): Payer: Self-pay | Admitting: *Deleted

## 2017-05-22 DIAGNOSIS — L0231 Cutaneous abscess of buttock: Secondary | ICD-10-CM | POA: Diagnosis present

## 2017-05-22 DIAGNOSIS — L0501 Pilonidal cyst with abscess: Secondary | ICD-10-CM | POA: Insufficient documentation

## 2017-05-22 DIAGNOSIS — F17200 Nicotine dependence, unspecified, uncomplicated: Secondary | ICD-10-CM | POA: Diagnosis not present

## 2017-05-22 MED ORDER — LIDOCAINE-EPINEPHRINE (PF) 2 %-1:200000 IJ SOLN
10.0000 mL | Freq: Once | INTRAMUSCULAR | Status: AC
  Administered 2017-05-22: 10 mL
  Filled 2017-05-22: qty 20

## 2017-05-22 NOTE — ED Triage Notes (Signed)
C/o abscess at the base of his spine onset 1 week ago. States he hit it last pm and it drained however he is concerned there may still be infection in it.

## 2017-05-22 NOTE — ED Provider Notes (Signed)
[facility] EMERGENCY DEPARTMENT Provider Note   CSN: [id number] Arrival date & time: 05/22/17  9629     History   Chief Complaint Chief Complaint  Patient presents with  . Abscess    HPI Andre Gibbs is a 31 y.o. male with history of pilonidal cyst presents to the ED for evaluation of mildly tender, swollen and draining pilonidal abscess x 2 days. Patient is worse with sitting downand palpation. No pain with bowel movements or blood in stool. Has tried Aleve with mild relief. Had incision and drainage of pilonidal abscess last year.He called his PCP was advised him to come to the ED for evaluation. No fevers or chills.  HPI  Past Medical History:  Diagnosis Date  . Allergy     Patient Active Problem List   Diagnosis Date Noted  . Penile discharge 03/29/2017  . Foreskin problem 03/29/2017  . Screen for STD (sexually transmitted disease) 03/29/2017  . Anxiety 03/29/2017  . Need for influenza vaccination 03/29/2017  . Chronic diarrhea 03/29/2017  . Encounter for health maintenance examination in adult 03/29/2017    History reviewed. No pertinent surgical history.     Home Medications    Prior to Admission medications   Medication Sig Start Date End Date Taking? Authorizing Provider  ibuprofen (ADVIL,MOTRIN) 800 MG tablet Take 1 tablet (800 mg total) by mouth 3 (three) times daily. 07/02/14   Mellody Drown, PA-C    Family History Family History  Problem Relation Age of Onset  . Stroke Mother        x3  . Hypertension Mother   . Other Father        history unknown  . Hypertension Maternal Grandmother   . Heart disease Maternal Grandmother   . Diabetes Maternal Grandmother   . Cancer Neg Hx     Social History Social History   Tobacco Use  . Smoking status: Current Some Day Smoker    Packs/day: 10.00    Last attempt to quit: 03/01/2013    Years since quitting: 4.2  . Smokeless tobacco: Never Used  Substance Use Topics  . Alcohol use:  Yes    Alcohol/week: 1.2 oz    Types: 1 Cans of beer, 1 Shots of liquor per week  . Drug use: No     Allergies   Patient has no known allergies.   Review of Systems Review of Systems  Constitutional: Negative for chills and fever.  Skin: Negative for color change.       +abscess     Physical Exam Updated Vital Signs BP 134/76 (BP Location: Right Arm)   Pulse 66   Temp 98.4 F (36.9 C) (Oral)   Resp 20   Ht 6\' 4"  (1.93 m)   Wt 122 kg (269 lb)   SpO2 98%   BMI 32.74 kg/m   Physical Exam  Constitutional: He is oriented to person, place, and time. He appears well-developed and well-nourished. No distress.  NAD.  HENT:  Head: Normocephalic and atraumatic.  Right Ear: External ear normal.  Left Ear: External ear normal.  Nose: Nose normal.  Eyes: Conjunctivae and EOM are normal. No scleral icterus.  Neck: Normal range of motion. Neck supple.  Cardiovascular: Normal rate, regular rhythm, normal heart sounds and intact distal pulses.  No murmur heard. Pulmonary/Chest: Effort normal and breath sounds normal. He has no wheezes.  Abdominal: Soft. There is no tenderness.  Genitourinary:  Genitourinary Comments: 2 x 3 cm area of induration with mild  tenderness to right natal cleft with midline pits, mild pressure expresses purulent tinged bloody discharge. No significant or extensive erythema, edema, warmth.   Musculoskeletal: Normal range of motion. He exhibits no deformity.  Neurological: He is alert and oriented to person, place, and time.  Skin: Skin is warm and dry. Capillary refill takes less than 2 seconds.  Psychiatric: He has a normal mood and affect. His behavior is normal. Judgment and thought content normal.  Nursing note and vitals reviewed.    ED Treatments / Results  Labs (all labs ordered are listed, but only abnormal results are displayed) Labs Reviewed - No data to display  EKG  EKG Interpretation None       Radiology No results  found.  Procedures .Marland Kitchen.Incision and Drainage Date/Time: 05/22/2017 11:24 AM Performed by: Liberty HandyGibbons, Abbie Berling J, PA-C Authorized by: Liberty HandyGibbons, Zabrina Brotherton J, PA-C   Consent:    Consent obtained:  Verbal   Consent given by:  Patient   Risks discussed:  Bleeding, incomplete drainage, infection, pain and damage to other organs   Alternatives discussed:  Delayed treatment and referral Location:    Type:  Abscess   Size:  2x3   Location:  Anogenital   Anogenital location:  Pilonidal Pre-procedure details:    Skin preparation:  Antiseptic wash Anesthesia (see MAR for exact dosages):    Anesthesia method:  Local infiltration   Local anesthetic:  Lidocaine 2% WITH epi Procedure type:    Complexity:  Simple Procedure details:    Needle aspiration: no     Incision types:  Single straight   Incision depth:  Dermal   Scalpel blade:  11   Wound management:  Probed and deloculated and irrigated with saline   Drainage:  Bloody, purulent and serosanguinous   Drainage amount:  Moderate   Wound treatment:  Wound left open   Packing materials:  1/4 in iodoform gauze Post-procedure details:    Patient tolerance of procedure:  Tolerated well, no immediate complications   (including critical care time)  Medications Ordered in ED Medications  lidocaine-EPINEPHrine (XYLOCAINE W/EPI) 2 %-1:200000 (PF) injection 10 mL (10 mLs Infiltration Given by Other 05/22/17 1006)     Initial Impression / Assessment and Plan / ED Course  I have reviewed the triage vital signs and the nursing notes.  Pertinent labs & imaging results that were available during my care of the patient were reviewed by me and considered in my medical decision making (see chart for details).    31 year old male presents with pain, drainage and induration to pilonidal cyst. Has history of previous incision and drainage last year. No fevers or chills. No pain with bowel movements. Bedside ultrasound was utilized which showed small amount  of cobblestoning consistent with cellulitis and buildup of fluid. Decision to in size and drain was made given sudden onset of focal symptoms and active draining during exam. Moderate amount of serosanguineous/bloody drainage was obtained. Packing was inserted. He is to follow-up in 2-3 days for reevaluation and wound check. There was significant resolution of induration after incision and drainage, and there are no extensive signs of cellulitis or tracking of induration. I don't think patient needs antibiotics today. PCP can add antibiotics in 2-3 days when reevaluation happens. Discussed signs and symptoms that would warrant ;rompt return to ED for reevaluation.  Final Clinical Impressions(s) / ED Diagnoses   Final diagnoses:  Pilonidal abscess    ED Discharge Orders    None       Sharen HeckGibbons, Peachie Barkalow  J, PA-C 05/22/17 1128    Melene PlanFloyd, Dan, DO 05/22/17 1648

## 2017-05-22 NOTE — Discharge Instructions (Signed)
Take 600 mg of ibuprofen every 8 hours, if pain is uncontrolled G can take an additional 1000 mg of Tylenol every 8 hours as well. It to some sitz bathsat least once daily and/or place a heating pad to the area to facilitate drainage. Follow up with the primary care provider in 2-3 days for wound check and packing removal and so that they can reevaluate for any evidence of infection.

## 2018-03-19 ENCOUNTER — Encounter (HOSPITAL_COMMUNITY): Payer: Self-pay

## 2018-03-19 ENCOUNTER — Other Ambulatory Visit: Payer: Self-pay

## 2018-03-19 ENCOUNTER — Ambulatory Visit (HOSPITAL_COMMUNITY)
Admission: EM | Admit: 2018-03-19 | Discharge: 2018-03-19 | Disposition: A | Discharge status: Home / Self Care | Payer: Commercial Managed Care - PPO | Attending: Family Medicine | Admitting: Family Medicine

## 2018-03-19 DIAGNOSIS — H1033 Unspecified acute conjunctivitis, bilateral: Secondary | ICD-10-CM | POA: Diagnosis not present

## 2018-03-19 MED ORDER — OLOPATADINE HCL 0.2 % OP SOLN: 1.0000 [drp] | Freq: Every day | OPHTHALMIC | 0 refills | Status: DC

## 2018-03-19 MED ORDER — POLYETHYL GLYCOL-PROPYL GLYCOL 0.4-0.3 % OP GEL: 1.0000 "application " | Freq: Every evening | OPHTHALMIC | 0 refills | Status: DC | PRN

## 2018-03-19 NOTE — ED Triage Notes (Signed)
Pt states his eyes hurt and they draining.

## 2018-03-19 NOTE — Discharge Instructions (Signed)
Use pataday eyedrops as directed on both eyes. Artificial tear gel at night. Wait 10-15 minutes between drops, always use artificial tear gel last, as it prevents drops from penetrating through. Lid scrubs and warm compresses as directed. Monitor for any worsening of symptoms, changes in vision, sensitivity to light, eye swelling, painful eye movement, follow up with ophthalmology for further evaluation.

## 2018-03-19 NOTE — ED Provider Notes (Signed)
[facility]    CSN: [id number] Arrival date & time: 03/19/18  1358     History   Chief Complaint Chief Complaint  Patient presents with  . Eye Drainage    HPI Andre Gibbs is a 32 y.o. male.   32 year old male comes in for bilateral eye irritation.  States right eye started 1 week ago, had crusting, redness, using the visit, was given an antibiotic eyedrop.  States has been using as directed without relief.  3 days ago, left eye started having similar symptoms.  States both eyes feel irritated, and if he rubs his eye, feels foreign body sensation.  However, if he stops rubbing his eye, for his body sensation resolves.  He denies photophobia, injury/trauma to the eye.  Has had some generalized blurry vision.  States mild crusting in the morning, but eye continues to water throughout the day.  Denies contact lens/glasses use.  Denies URI symptoms such as cough, congestion, sore throat.  Denies sneezing.     Past Medical History:  Diagnosis Date  . Allergy     Patient Active Problem List   Diagnosis Date Noted  . Penile discharge 03/29/2017  . Foreskin problem 03/29/2017  . Screen for STD (sexually transmitted disease) 03/29/2017  . Anxiety 03/29/2017  . Need for influenza vaccination 03/29/2017  . Chronic diarrhea 03/29/2017  . Encounter for health maintenance examination in adult 03/29/2017    History reviewed. No pertinent surgical history.     Home Medications    Prior to Admission medications   Medication Sig Start Date End Date Taking? Authorizing Provider  ibuprofen (ADVIL,MOTRIN) 800 MG tablet Take 1 tablet (800 mg total) by mouth 3 (three) times daily. 07/02/14   Mellody Drown, PA-C  Olopatadine HCl 0.2 % SOLN Apply 1 drop to eye daily. 03/19/18   Cathie Hoops, Hala Narula V, PA-C  Polyethyl Glycol-Propyl Glycol (SYSTANE) 0.4-0.3 % GEL ophthalmic gel Place 1 application into both eyes at bedtime as needed. 03/19/18   Belinda Fisher, PA-C    Family History Family History    Problem Relation Age of Onset  . Stroke Mother        x3  . Hypertension Mother   . Other Father        history unknown  . Hypertension Maternal Grandmother   . Heart disease Maternal Grandmother   . Diabetes Maternal Grandmother   . Cancer Neg Hx     Social History Social History   Tobacco Use  . Smoking status: Current Some Day Smoker    Packs/day: 10.00    Last attempt to quit: 03/01/2013    Years since quitting: 5.0  . Smokeless tobacco: Never Used  Substance Use Topics  . Alcohol use: Yes    Alcohol/week: 2.0 standard drinks    Types: 1 Cans of beer, 1 Shots of liquor per week  . Drug use: No     Allergies   Patient has no known allergies.   Review of Systems Review of Systems  Reason unable to perform ROS: See HPI as above.     Physical Exam Triage Vital Signs ED Triage Vitals  Enc Vitals Group     BP 03/19/18 1458 (!) 150/89     Pulse Rate 03/19/18 1458 88     Resp 03/19/18 1458 18     Temp 03/19/18 1458 98.5 F (36.9 C)     Temp Source 03/19/18 1458 Oral     SpO2 03/19/18 1458 98 %     Weight  03/19/18 1459 275 lb 3.2 oz (124.8 kg)     Height --      Head Circumference --      Peak Flow --      Pain Score --      Pain Loc --      Pain Edu? --      Excl. in GC? --    No data found.  Updated Vital Signs BP (!) 150/89 (BP Location: Right Arm)   Pulse 88   Temp 98.5 F (36.9 C) (Oral)   Resp 18   Wt 275 lb 3.2 oz (124.8 kg)   SpO2 98%   BMI 33.50 kg/m   Visual Acuity Right Eye Distance:   20/40 ([place]) Left Eye Distance:   20/25 ([place]) Bilateral Distance:    Right Eye Near:   Left Eye Near:    Bilateral Near:     Physical Exam  Constitutional: He is oriented to person, place, and time. He appears well-developed and well-nourished. No distress.  HENT:  Head: Normocephalic and atraumatic.  Eyes: Pupils are equal, round, and reactive to light. EOM and lids are normal. Lids are everted and swept, no foreign bodies found. No foreign body  present in the right eye. No foreign body present in the left eye. Right conjunctiva is injected. Left conjunctiva is injected.  No ciliary injection.   Neck: Normal range of motion. Neck supple.  Neurological: He is alert and oriented to person, place, and time.  Skin: Skin is warm and dry.   UC Treatments / Results  Labs (all labs ordered are listed, but only abnormal results are displayed) Labs Reviewed - No data to display  EKG None  Radiology No results found.  Procedures Procedures (including critical care time)  Medications Ordered in UC Medications - No data to display  Initial Impression / Assessment and Plan / UC Course  I have reviewed the triage vital signs and the nursing notes.  Pertinent labs & imaging results that were available during my care of the patient were reviewed by me and considered in my medical decision making (see chart for details).    Discussed possible blepharitis/allergic conjunctivitis causing symptoms. Start pataday as directed. Artificial tears gel as directed. Lid scrubs and warm compresses as directed. Patient to follow up with ophthalmology if symptoms worsens or does not improve. Return precautions given.   Final Clinical Impressions(s) / UC Diagnoses   Final diagnoses:  Acute conjunctivitis of both eyes, unspecified acute conjunctivitis type    ED Prescriptions    Medication Sig Dispense Auth. Provider   Olopatadine HCl 0.2 % SOLN Apply 1 drop to eye daily. 2.5 mL Jenessa Gillingham V, PA-C   Polyethyl Glycol-Propyl Glycol (SYSTANE) 0.4-0.3 % GEL ophthalmic gel Place 1 application into both eyes at bedtime as needed. 1 Bottle Th[place] Alpha, [place] 03/19/18 1630

## 2019-11-18 ENCOUNTER — Encounter (HOSPITAL_COMMUNITY): Payer: Self-pay

## 2019-11-18 ENCOUNTER — Emergency Department (HOSPITAL_COMMUNITY)
Admission: EM | Admit: 2019-11-18 | Discharge: 2019-11-18 | Disposition: A | Discharge status: Home / Self Care | Payer: PRIVATE HEALTH INSURANCE | Attending: Emergency Medicine | Admitting: Emergency Medicine

## 2019-11-18 ENCOUNTER — Emergency Department (HOSPITAL_COMMUNITY): Payer: PRIVATE HEALTH INSURANCE

## 2019-11-18 ENCOUNTER — Other Ambulatory Visit: Payer: Self-pay

## 2019-11-18 DIAGNOSIS — K529 Noninfective gastroenteritis and colitis, unspecified: Secondary | ICD-10-CM | POA: Insufficient documentation

## 2019-11-18 DIAGNOSIS — R112 Nausea with vomiting, unspecified: Secondary | ICD-10-CM | POA: Insufficient documentation

## 2019-11-18 DIAGNOSIS — R1032 Left lower quadrant pain: Secondary | ICD-10-CM | POA: Insufficient documentation

## 2019-11-18 DIAGNOSIS — F17210 Nicotine dependence, cigarettes, uncomplicated: Secondary | ICD-10-CM | POA: Insufficient documentation

## 2019-11-18 LAB — COMPREHENSIVE METABOLIC PANEL
ALT: 29 U/L (ref 0–44)
AST: 27 U/L (ref 15–41)
Albumin: 4.6 g/dL (ref 3.5–5.0)
Alkaline Phosphatase: 54 U/L (ref 38–126)
Anion gap: 9 (ref 5–15)
BUN: 18 mg/dL (ref 6–20)
CO2: 25 mmol/L (ref 22–32)
Calcium: 9.3 mg/dL (ref 8.9–10.3)
Chloride: 105 mmol/L (ref 98–111)
Creatinine, Ser: 1.01 mg/dL (ref 0.61–1.24)
GFR calc Af Amer: >60 mL/min (ref >60)
GFR calc non Af Amer: >60 mL/min (ref >60)
Glucose, Bld: 107 mg/dL — ABNORMAL HIGH (ref 70–99)
Potassium: 3.4 mmol/L — ABNORMAL LOW (ref 3.5–5.1)
Sodium: 139 mmol/L (ref 135–145)
Total Bilirubin: 0.5 mg/dL (ref 0.3–1.2)
Total Protein: 8.4 g/dL — ABNORMAL HIGH (ref 6.5–8.1)

## 2019-11-18 LAB — URINALYSIS, ROUTINE W REFLEX MICROSCOPIC
Bilirubin Urine: NEGATIVE
Glucose, UA: NEGATIVE mg/dL
Hgb urine dipstick: NEGATIVE
Ketones, ur: 20 mg/dL — AB
Leukocytes,Ua: NEGATIVE
Nitrite: NEGATIVE
Protein, ur: 30 mg/dL — AB
Specific Gravity, Urine: 1.028 (ref 1.005–1.030)
pH: 8 (ref 5.0–8.0)

## 2019-11-18 LAB — CBC
HCT: 45 % (ref 39.0–52.0)
Hemoglobin: 15.3 g/dL (ref 13.0–17.0)
MCH: 30.2 pg (ref 26.0–34.0)
MCHC: 34 g/dL (ref 30.0–36.0)
MCV: 88.9 fL (ref 80.0–100.0)
Platelets: 261 10*3/uL (ref 150–400)
RBC: 5.06 MIL/uL (ref 4.22–5.81)
RDW: 11.7 % (ref 11.5–15.5)
WBC: 8.6 10*3/uL (ref 4.0–10.5)
nRBC: 0 % (ref 0.0–0.2)

## 2019-11-18 LAB — CBG MONITORING, ED: Glucose-Capillary: 97 mg/dL (ref 70–99)

## 2019-11-18 LAB — LIPASE, BLOOD: Lipase: 50 U/L (ref 11–51)

## 2019-11-18 MED ORDER — METRONIDAZOLE 500 MG PO TABS
500.0000 mg | ORAL_TABLET | Freq: Two times a day (BID) | ORAL | 0 refills | Status: DC
Start: 2019-11-18 — End: 2019-11-22

## 2019-11-18 MED ORDER — LACTATED RINGERS IV BOLUS: 1000.0000 mL | Freq: Once | INTRAVENOUS | Status: DC

## 2019-11-18 MED ORDER — LACTATED RINGERS IV BOLUS
2000.0000 mL | Freq: Once | INTRAVENOUS | Status: AC
  Administered 2019-11-18: 03:00:00 2000 mL via INTRAVENOUS

## 2019-11-18 MED ORDER — PROMETHAZINE HCL 25 MG PO TABS
25.0000 mg | ORAL_TABLET | Freq: Four times a day (QID) | ORAL | 0 refills | Status: DC | PRN
Start: 2019-11-18 — End: 2019-11-29

## 2019-11-18 MED ORDER — IOHEXOL 300 MG/ML  SOLN
100.0000 mL | Freq: Once | INTRAMUSCULAR | Status: AC | PRN
  Administered 2019-11-18: 06:00:00 100 mL via INTRAVENOUS

## 2019-11-18 MED ORDER — METRONIDAZOLE 500 MG PO TABS
500.0000 mg | ORAL_TABLET | Freq: Once | ORAL | Status: AC
  Administered 2019-11-18: 11:00:00 500 mg via ORAL
  Filled 2019-11-18: qty 1

## 2019-11-18 MED ORDER — SODIUM CHLORIDE (PF) 0.9 % IJ SOLN
INTRAMUSCULAR | Status: AC
  Filled 2019-11-18: qty 50

## 2019-11-18 MED ORDER — CIPROFLOXACIN HCL 500 MG PO TABS
500.0000 mg | ORAL_TABLET | Freq: Two times a day (BID) | ORAL | 0 refills | Status: DC
Start: 2019-11-18 — End: 2019-11-22

## 2019-11-18 MED ORDER — ONDANSETRON HCL 4 MG/2ML IJ SOLN
4.0000 mg | Freq: Once | INTRAMUSCULAR | Status: AC
  Administered 2019-11-18: 03:00:00 4 mg via INTRAVENOUS
  Filled 2019-11-18: qty 2

## 2019-11-18 MED ORDER — PROMETHAZINE HCL 25 MG/ML IJ SOLN
25.0000 mg | Freq: Once | INTRAMUSCULAR | Status: AC
  Administered 2019-11-18: 05:00:00 25 mg via INTRAVENOUS
  Filled 2019-11-18: qty 1

## 2019-11-18 MED ORDER — CIPROFLOXACIN HCL 500 MG PO TABS
500.0000 mg | ORAL_TABLET | Freq: Once | ORAL | Status: AC
  Administered 2019-11-18: 11:00:00 500 mg via ORAL
  Filled 2019-11-18: qty 1

## 2019-11-18 MED ORDER — SODIUM CHLORIDE 0.9% FLUSH
3.0000 mL | Freq: Once | INTRAVENOUS | Status: AC
  Administered 2019-11-18: 02:00:00 3 mL via INTRAVENOUS

## 2019-11-18 MED ORDER — MORPHINE SULFATE (PF) 4 MG/ML IV SOLN
4.0000 mg | Freq: Once | INTRAVENOUS | Status: AC
  Administered 2019-11-18: 08:00:00 4 mg via INTRAVENOUS
  Filled 2019-11-18: qty 1

## 2019-11-18 MED ORDER — DROPERIDOL 2.5 MG/ML IJ SOLN
1.2500 mg | Freq: Once | INTRAMUSCULAR | Status: AC
  Administered 2019-11-18: 09:00:00 1.25 mg via INTRAVENOUS
  Filled 2019-11-18: qty 2

## 2019-11-18 NOTE — ED Notes (Signed)
Pt encouraged to provide urine specimen but pt unable to void at this time. Pt aware a urine specimen is needed.

## 2019-11-18 NOTE — ED Provider Notes (Signed)
  Physical Exam  BP (!) 179/95   Pulse 97   Temp 98.5 F (36.9 C) (Oral)   Resp 19   Ht 6\' 4"  (1.93 m)   Wt 121.1 kg   SpO2 98%   BMI 32.50 kg/m   Physical Exam Vitals and nursing note reviewed.  Constitutional:      Appearance: Normal appearance.  HENT:     Head: Normocephalic.  Eyes:     Conjunctiva/sclera: Conjunctivae normal.  Pulmonary:     Effort: Pulmonary effort is normal.  Skin:    General: Skin is dry.  Neurological:     Mental Status: He is alert.  Psychiatric:        Mood and Affect: Mood normal.     ED Course/Procedures   Clinical Course as of Nov 18 1506  Tue Nov 18, 2019  Nov 20, 2019 Patient re-evaluated, he is diaphoretic, standing at the sink vomiting.  Instructed to return to bed, phenergan ordered.    [EH]  [id number] Patient was reassessed and continues to have vomiting and pain. Morphine and droperidol ordered. Patient has failed zofran, phenergan.   [KM]  1102 Patient was improved with additional meds and tolerated PO antibiotics. Discussed admission vs outpatient treatment and patient prefers to go home. He is afebrile, tolerating PO. Advised on return precautions.    [KM]    Clinical Course User Index [EH] 6010, PA-C [KM] Cristina Gong    Procedures  MDM  Patient care assumed from [facility] due to change of shift.  Please see her note for full HPI.  Briefly this is a 34 year old male presenting for left-sided abdominal pain since 8 PM last night.  Initially vomiting was hard to control.  After Phenergan, patient is sleeping and has not had any episodes of emesis.. Labs are overall reassuring without any significant metabolic derangements.  White count is normal.  CT abdomen pelvis reveals signs of colitis in the descending and sigmoid colon.  We will treat for an infectious colitis.     38 11/18/19 1508    01/18/20, MD 11/18/19 1600

## 2019-11-18 NOTE — ED Notes (Signed)
Attempted to obtain pending blood work; unable to access. Lillia Abed, RN aware.

## 2019-11-18 NOTE — Discharge Instructions (Addendum)
You were seen today for abdominal pain. It appears you have inflammation in your colin causing the pain. We think this may be from an infection so we are giving you antibiotics which should clear this up in a few days. It is important to stay hydrated with plenty of fluids and take medications as prescribed. If you feel worse, get a fever, or are unable to stay hydrated please return to the ER. Thank you for allowing me to care for you today. Please return to the emergency department if you have new or worsening symptoms. Take your medications as instructed.

## 2019-11-18 NOTE — ED Triage Notes (Signed)
Vomiting since this morning, abdominal pain (mainly on the left side). Denies fevers

## 2019-11-18 NOTE — ED Provider Notes (Signed)
[facility]EMERGENCY [facility]PT Provider Note   CSN: [id number] Arrival date & time: 11/18/19  0040     History Chief Complaint  Patient presents with  . Abdominal Pain    Andre Gibbs is a 34 y.o. male with no pertinent past medical history who presents today for evaluation of vomiting and left-sided abdominal pain since 8 PM tonight.  He has vomited about 6 times.  No blood in the vomit.  His last bowel movement was a few hours ago and was normal for him.  He denies any prior abdominal surgeries or recent trauma.  He states that prior to the onset of his symptoms he ate at [employer], however otherwise felt normal.  He denies any headache.  No drug use.  His vomiting and nausea is more severe than his abdominal pain.   HPI     Past Medical History:  Diagnosis Date  . Allergy     Patient Active Problem List   Diagnosis Date Noted  . Penile discharge 03/29/2017  . Foreskin problem 03/29/2017  . Screen for STD (sexually transmitted disease) 03/29/2017  . Anxiety 03/29/2017  . Need for influenza vaccination 03/29/2017  . Chronic diarrhea 03/29/2017  . Encounter for health maintenance examination in adult 03/29/2017    No past surgical history on file.     Family History  Problem Relation Age of Onset  . Stroke Mother        x3  . Hypertension Mother   . Other Father        history unknown  . Hypertension Maternal Grandmother   . Heart disease Maternal Grandmother   . Diabetes Maternal Grandmother   . Cancer Neg Hx     Social History   Tobacco Use  . Smoking status: Current Some Day Smoker    Packs/day: 10.00    Last attempt to quit: 03/01/2013    Years since quitting: 6.7  . Smokeless tobacco: Never Used  Substance Use Topics  . Alcohol use: Yes    Alcohol/week: 2.0 standard drinks    Types: 1 Cans of beer, 1 Shots of liquor per week  . Drug use: No    Home Medications Prior to Admission medications   Medication Sig Start Date End Date  Taking? Authorizing Provider  ibuprofen (ADVIL,MOTRIN) 800 MG tablet Take 1 tablet (800 mg total) by mouth 3 (three) times daily. Patient not taking: Reported on 11/18/2019 07/02/14   Mellody Drown, PA-C  Olopatadine HCl 0.2 % SOLN Apply 1 drop to eye daily. Patient not taking: Reported on 11/18/2019 03/19/18   Belinda Fisher, PA-C  Polyethyl Glycol-Propyl Glycol (SYSTANE) 0.4-0.3 % GEL ophthalmic gel Place 1 application into both eyes at bedtime as needed. Patient not taking: Reported on 11/18/2019 03/19/18   Belinda Fisher, PA-C    Allergies    Patient has no known allergies.  Review of Systems   Review of Systems  Constitutional: Positive for fatigue. Negative for chills and fever.  Gastrointestinal: Positive for abdominal pain, nausea and vomiting. Negative for blood in stool, constipation and diarrhea.  Genitourinary: Negative for testicular pain.  Musculoskeletal: Negative for back pain.  Neurological: Negative for weakness and headaches.  All other systems reviewed and are negative.   Physical Exam Updated Vital Signs BP (!) 153/89   Pulse 100   Temp 98.5 F (36.9 C) (Oral)   Resp (!) 24   Ht 6\' 4"  (1.93 m)   Wt 121.1 kg   SpO2 97%   BMI  32.50 kg/m   Physical Exam Vitals and nursing note reviewed.  Constitutional:      Appearance: He is well-developed. He is ill-appearing. He is not diaphoretic.     Comments: Actively vomiting  HENT:     Head: Normocephalic and atraumatic.  Eyes:     General: No scleral icterus.       Right eye: No discharge.        Left eye: No discharge.     Conjunctiva/sclera: Conjunctivae normal.  Cardiovascular:     Rate and Rhythm: Regular rhythm. Tachycardia present.  Pulmonary:     Effort: Pulmonary effort is normal. No respiratory distress.     Breath sounds: No stridor.  Abdominal:     General: Bowel sounds are increased. There is no distension.     Palpations: Abdomen is soft.     Tenderness: There is abdominal tenderness in the left lower  quadrant. There is guarding and rebound.  Musculoskeletal:        General: No deformity.     Cervical back: Normal range of motion.  Skin:    General: Skin is warm and dry.  Neurological:     General: No focal deficit present.     Mental Status: He is alert.     Motor: No abnormal muscle tone.  Psychiatric:        Mood and Affect: Mood normal.        Behavior: Behavior normal.     ED Results / Procedures / Treatments   Labs (all labs ordered are listed, but only abnormal results are displayed) Labs Reviewed  COMPREHENSIVE METABOLIC PANEL - Abnormal; Notable for the following components:      Result Value   Potassium 3.4 (*)    Glucose, Bld 107 (*)    Total Protein 8.4 (*)    All other components within normal limits  URINALYSIS, ROUTINE W REFLEX MICROSCOPIC - Abnormal; Notable for the following components:   APPearance HAZY (*)    Ketones, ur 20 (*)    Protein, ur 30 (*)    Bacteria, UA RARE (*)    All other components within normal limits  LIPASE, BLOOD  CBC  CBG MONITORING, ED    EKG EKG Interpretation  Date/Time:  Tuesday Nov 18 2019 04:56:06 EDT Ventricular Rate:  95 PR Interval:    QRS Duration: 110 QT Interval:  356 QTC Calculation: 448 R Axis:   76 Text Interpretation: Sinus rhythm Probable left ventricular hypertrophy Otherwise within normal limits No old tracing to compare Confirmed by Delora Fuel ([id number]) on 11/18/2019 5:02:38 AM   Radiology No results found.  Procedures Procedures (including critical care time)  Medications Ordered in ED Medications  sodium chloride (PF) 0.9 % injection (has no administration in time range)  sodium chloride flush (NS) 0.9 % injection 3 mL (3 mLs Intravenous Given 11/18/19 0211)  ondansetron (ZOFRAN) injection 4 mg (4 mg Intravenous Given 11/18/19 0318)  lactated ringers bolus 2,000 mL (0 mLs Intravenous Stopped 11/18/19 0459)  promethazine (PHENERGAN) injection 25 mg (25 mg Intravenous Given 11/18/19 0459)  iohexol  (OMNIPAQUE) 300 MG/ML solution 100 mL (100 mLs Intravenous Contrast Given 11/18/19 0605)    ED Course  I have reviewed the triage vital signs and the nursing notes.  Pertinent labs & imaging results that were available during my care of the patient were reviewed by me and considered in my medical decision making (see chart for details).  Clinical Course as of Nov 17 652  Tue Nov 18, 2019  2376 Patient re-evaluated, he is diaphoretic, standing at the sink vomiting.  Instructed to return to bed, phenergan ordered.    [EH]    Clinical Course User Index [EH] Norman Clay   MDM Rules/Calculators/A&P                     Patient is a 34 year old man who presents today for evaluation of sudden onset nausea, and vomiting.  On exam he appears to feel unwell.  He is tachycardic, mildly tachypneic and actively vomiting.  I do not suspect his symptoms are from sepsis, rather related to his vomiting, and dehydration.   He is given 2 L IV fluids.  His symptoms are treated with Zofran.  After this he continued to vomit and was given Phenergan.  Labs are obtained and reviewed, CMP shows mild hypokalemia with a potassium of 3.4 UA with ketones and rare bacteria.  CBG is not low.  Lipase is normal.  CBC without significant acute abnormalities.  He is afebrile.  CT scan is ordered.   At shift change care was transferred to K. Mclean PA-C  who will follow pending studies, re-evaulate and determine disposition.     Final Clinical Impression(s) / ED Diagnoses Final diagnoses:  Left lower quadrant abdominal pain  Nausea and vomiting, intractability of vomiting not specified, unspecified vomiting type    Rx / DC Orders ED Discharge Orders    None       Norman Clay 11/18/19 0654    Dione Booze, MD 11/19/19 [phone]

## 2019-11-19 ENCOUNTER — Other Ambulatory Visit: Payer: Self-pay

## 2019-11-19 ENCOUNTER — Inpatient Hospital Stay (HOSPITAL_BASED_OUTPATIENT_CLINIC_OR_DEPARTMENT_OTHER)
Admission: EM | Admit: 2019-11-19 | Discharge: 2019-11-22 | DRG: 392 | Disposition: A | Discharge status: Home / Self Care | Payer: PRIVATE HEALTH INSURANCE | Attending: Internal Medicine | Admitting: Internal Medicine

## 2019-11-19 ENCOUNTER — Encounter (HOSPITAL_BASED_OUTPATIENT_CLINIC_OR_DEPARTMENT_OTHER): Payer: Self-pay | Admitting: Emergency Medicine

## 2019-11-19 DIAGNOSIS — Z20822 Contact with and (suspected) exposure to covid-19: Secondary | ICD-10-CM | POA: Diagnosis present

## 2019-11-19 DIAGNOSIS — Z6832 Body mass index (BMI) 32.0-32.9, adult: Secondary | ICD-10-CM

## 2019-11-19 DIAGNOSIS — Z823 Family history of stroke: Secondary | ICD-10-CM

## 2019-11-19 DIAGNOSIS — E669 Obesity, unspecified: Secondary | ICD-10-CM | POA: Diagnosis present

## 2019-11-19 DIAGNOSIS — A09 Infectious gastroenteritis and colitis, unspecified: Principal | ICD-10-CM | POA: Diagnosis present

## 2019-11-19 DIAGNOSIS — E876 Hypokalemia: Secondary | ICD-10-CM | POA: Diagnosis present

## 2019-11-19 DIAGNOSIS — R112 Nausea with vomiting, unspecified: Secondary | ICD-10-CM | POA: Diagnosis present

## 2019-11-19 DIAGNOSIS — D72829 Elevated white blood cell count, unspecified: Secondary | ICD-10-CM

## 2019-11-19 DIAGNOSIS — Z833 Family history of diabetes mellitus: Secondary | ICD-10-CM

## 2019-11-19 DIAGNOSIS — Z8249 Family history of ischemic heart disease and other diseases of the circulatory system: Secondary | ICD-10-CM

## 2019-11-19 DIAGNOSIS — R1115 Cyclical vomiting syndrome unrelated to migraine: Secondary | ICD-10-CM | POA: Diagnosis present

## 2019-11-19 DIAGNOSIS — Z720 Tobacco use: Secondary | ICD-10-CM

## 2019-11-19 DIAGNOSIS — F1290 Cannabis use, unspecified, uncomplicated: Secondary | ICD-10-CM | POA: Diagnosis present

## 2019-11-19 DIAGNOSIS — F17210 Nicotine dependence, cigarettes, uncomplicated: Secondary | ICD-10-CM | POA: Diagnosis present

## 2019-11-19 DIAGNOSIS — K529 Noninfective gastroenteritis and colitis, unspecified: Secondary | ICD-10-CM

## 2019-11-19 LAB — URINALYSIS, ROUTINE W REFLEX MICROSCOPIC
Glucose, UA: NEGATIVE mg/dL
Hgb urine dipstick: NEGATIVE
Ketones, ur: >80 mg/dL — AB
Leukocytes,Ua: NEGATIVE
Nitrite: NEGATIVE
Protein, ur: 30 mg/dL — AB
Specific Gravity, Urine: 1.015 (ref 1.005–1.030)
pH: 7.5 (ref 5.0–8.0)

## 2019-11-19 LAB — COMPREHENSIVE METABOLIC PANEL
ALT: 24 U/L (ref 0–44)
AST: 20 U/L (ref 15–41)
Albumin: 3.8 g/dL (ref 3.5–5.0)
Alkaline Phosphatase: 52 U/L (ref 38–126)
Anion gap: 8 (ref 5–15)
BUN: 17 mg/dL (ref 6–20)
CO2: 25 mmol/L (ref 22–32)
Calcium: 8.7 mg/dL — ABNORMAL LOW (ref 8.9–10.3)
Chloride: 100 mmol/L (ref 98–111)
Creatinine, Ser: 0.93 mg/dL (ref 0.61–1.24)
GFR calc Af Amer: >60 mL/min (ref >60)
GFR calc non Af Amer: >60 mL/min (ref >60)
Glucose, Bld: 100 mg/dL — ABNORMAL HIGH (ref 70–99)
Potassium: 3.2 mmol/L — ABNORMAL LOW (ref 3.5–5.1)
Sodium: 133 mmol/L — ABNORMAL LOW (ref 135–145)
Total Bilirubin: 0.6 mg/dL (ref 0.3–1.2)
Total Protein: 7.4 g/dL (ref 6.5–8.1)

## 2019-11-19 LAB — URINE CULTURE: Culture: NO GROWTH

## 2019-11-19 LAB — CBC WITH DIFFERENTIAL/PLATELET
Abs Immature Granulocytes: 0.07 10*3/uL (ref 0.00–0.07)
Basophils Absolute: 0 10*3/uL (ref 0.0–0.1)
Basophils Relative: 0 %
Eosinophils Absolute: 0 10*3/uL (ref 0.0–0.5)
Eosinophils Relative: 0 %
HCT: 42.8 % (ref 39.0–52.0)
Hemoglobin: 14.5 g/dL (ref 13.0–17.0)
Immature Granulocytes: 1 %
Lymphocytes Relative: 16 %
Lymphs Abs: 2.3 10*3/uL (ref 0.7–4.0)
MCH: 29.3 pg (ref 26.0–34.0)
MCHC: 33.9 g/dL (ref 30.0–36.0)
MCV: 86.5 fL (ref 80.0–100.0)
Monocytes Absolute: 1.4 10*3/uL — ABNORMAL HIGH (ref 0.1–1.0)
Monocytes Relative: 10 %
Neutro Abs: 10.7 10*3/uL — ABNORMAL HIGH (ref 1.7–7.7)
Neutrophils Relative %: 73 %
Platelets: 303 10*3/uL (ref 150–400)
RBC: 4.95 MIL/uL (ref 4.22–5.81)
RDW: 11.7 % (ref 11.5–15.5)
WBC: 14.5 10*3/uL — ABNORMAL HIGH (ref 4.0–10.5)
nRBC: 0 % (ref 0.0–0.2)

## 2019-11-19 LAB — URINALYSIS, MICROSCOPIC (REFLEX)

## 2019-11-19 LAB — RESPIRATORY PANEL BY RT PCR (FLU A&B, COVID)
Influenza A by PCR: NEGATIVE
Influenza B by PCR: NEGATIVE
SARS Coronavirus 2 by RT PCR: NEGATIVE

## 2019-11-19 LAB — LIPASE, BLOOD: Lipase: 25 U/L (ref 11–51)

## 2019-11-19 LAB — MAGNESIUM: Magnesium: 1.8 mg/dL (ref 1.7–2.4)

## 2019-11-19 MED ORDER — ENOXAPARIN SODIUM 60 MG/0.6ML ~~LOC~~ SOLN
60.0000 mg | Freq: Every day | SUBCUTANEOUS | Status: DC
  Administered 2019-11-19 – 2019-11-21 (×3): 60 mg via SUBCUTANEOUS
  Filled 2019-11-19 (×5): qty 0.6

## 2019-11-19 MED ORDER — ACETAMINOPHEN 325 MG PO TABS: 650.0000 mg | ORAL_TABLET | Freq: Four times a day (QID) | ORAL | Status: DC | PRN

## 2019-11-19 MED ORDER — SODIUM CHLORIDE 0.9 % IV SOLN: INTRAVENOUS | Status: DC

## 2019-11-19 MED ORDER — METOCLOPRAMIDE HCL 5 MG/ML IJ SOLN
5.0000 mg | Freq: Once | INTRAMUSCULAR | Status: AC
  Administered 2019-11-19: 5 mg via INTRAVENOUS
  Filled 2019-11-19: qty 2

## 2019-11-19 MED ORDER — NICOTINE 14 MG/24HR TD PT24
14.0000 mg | MEDICATED_PATCH | Freq: Every day | TRANSDERMAL | Status: DC
  Administered 2019-11-20 – 2019-11-22 (×3): 14 mg via TRANSDERMAL
  Filled 2019-11-19 (×3): qty 1

## 2019-11-19 MED ORDER — METRONIDAZOLE IN NACL 5-0.79 MG/ML-% IV SOLN
500.0000 mg | Freq: Three times a day (TID) | INTRAVENOUS | Status: DC
  Administered 2019-11-19 – 2019-11-22 (×7): 500 mg via INTRAVENOUS
  Filled 2019-11-19 (×9): qty 100

## 2019-11-19 MED ORDER — SODIUM CHLORIDE 0.9 % IV BOLUS
1000.0000 mL | Freq: Once | INTRAVENOUS | Status: AC
  Administered 2019-11-19: 1000 mL via INTRAVENOUS

## 2019-11-19 MED ORDER — PROMETHAZINE HCL 25 MG/ML IJ SOLN
INTRAMUSCULAR | Status: AC
  Filled 2019-11-19: qty 1

## 2019-11-19 MED ORDER — KETOROLAC TROMETHAMINE 30 MG/ML IJ SOLN: 30.0000 mg | Freq: Four times a day (QID) | INTRAMUSCULAR | Status: DC | PRN

## 2019-11-19 MED ORDER — PROCHLORPERAZINE EDISYLATE 10 MG/2ML IJ SOLN
10.0000 mg | Freq: Once | INTRAMUSCULAR | Status: AC
  Administered 2019-11-19: 21:00:00 10 mg via INTRAVENOUS
  Filled 2019-11-19: qty 2

## 2019-11-19 MED ORDER — PROMETHAZINE HCL 25 MG/ML IJ SOLN
25.0000 mg | Freq: Once | INTRAMUSCULAR | Status: AC
  Administered 2019-11-19: 25 mg via INTRAVENOUS

## 2019-11-19 MED ORDER — ONDANSETRON HCL 4 MG/2ML IJ SOLN
4.0000 mg | Freq: Four times a day (QID) | INTRAMUSCULAR | Status: DC | PRN
  Administered 2019-11-20: 4 mg via INTRAVENOUS
  Filled 2019-11-19: qty 2

## 2019-11-19 MED ORDER — PANTOPRAZOLE SODIUM 40 MG IV SOLR
40.0000 mg | Freq: Once | INTRAVENOUS | Status: AC
  Administered 2019-11-19: 40 mg via INTRAVENOUS
  Filled 2019-11-19: qty 40

## 2019-11-19 MED ORDER — POTASSIUM CHLORIDE 10 MEQ/100ML IV SOLN
10.0000 meq | INTRAVENOUS | Status: AC
  Administered 2019-11-19 – 2019-11-20 (×4): 10 meq via INTRAVENOUS
  Filled 2019-11-19 (×4): qty 100

## 2019-11-19 MED ORDER — ONDANSETRON HCL 4 MG/2ML IJ SOLN
4.0000 mg | Freq: Once | INTRAMUSCULAR | Status: AC
  Administered 2019-11-19: 11:00:00 4 mg via INTRAVENOUS
  Filled 2019-11-19: qty 2

## 2019-11-19 MED ORDER — SODIUM CHLORIDE 0.9 % IV SOLN
2.0000 g | Freq: Every day | INTRAVENOUS | Status: DC
  Administered 2019-11-19 – 2019-11-21 (×3): 2 g via INTRAVENOUS
  Filled 2019-11-19: qty 20
  Filled 2019-11-19: qty 2
  Filled 2019-11-19: qty 20
  Filled 2019-11-19: qty 2

## 2019-11-19 MED ORDER — ACETAMINOPHEN 650 MG RE SUPP: 650.0000 mg | Freq: Four times a day (QID) | RECTAL | Status: DC | PRN

## 2019-11-19 NOTE — ED Provider Notes (Addendum)
[facility] EMERGENCY DEPARTMENT Provider Note   CSN: [id number] Arrival date & time: 11/19/19  9702     History Chief Complaint  Patient presents with  . Emesis  . Dizziness    Andre Gibbs is a 34 y.o. male.  Presented to ER with chief complaint emesis, nausea.  Patient has been having intermittent episodes over the last few days, came to ER on 5/4, diagnosed with colitis, discharged with antibiotics and Phenergan.  Patient reports taking antibiotics and Phenergan as needed.  No significant improvement in his symptoms.  Today having worsening vomiting, noted small specks of blood in vomit today, vomit is yellow to brown.  No worsening abdominal pain, not really having much for pain just severe nausea.  No fever.  HPI     Past Medical History:  Diagnosis Date  . Allergy     Patient Active Problem List   Diagnosis Date Noted  . Intractable nausea and vomiting 11/19/2019  . Penile discharge 03/29/2017  . Foreskin problem 03/29/2017  . Screen for STD (sexually transmitted disease) 03/29/2017  . Anxiety 03/29/2017  . Need for influenza vaccination 03/29/2017  . Chronic diarrhea 03/29/2017  . Encounter for health maintenance examination in adult 03/29/2017    History reviewed. No pertinent surgical history.     Family History  Problem Relation Age of Onset  . Stroke Mother        x3  . Hypertension Mother   . Other Father        history unknown  . Hypertension Maternal Grandmother   . Heart disease Maternal Grandmother   . Diabetes Maternal Grandmother   . Cancer Neg Hx     Social History   Tobacco Use  . Smoking status: Current Some Day Smoker    Packs/day: 10.00    Last attempt to quit: 03/01/2013    Years since quitting: 6.7  . Smokeless tobacco: Never Used  Substance Use Topics  . Alcohol use: Yes    Alcohol/week: 2.0 standard drinks    Types: 1 Cans of beer, 1 Shots of liquor per week  . Drug use: No    Home Medications Prior to Admission  medications   Medication Sig Start Date End Date Taking? Authorizing Provider  ciprofloxacin (CIPRO) 500 MG tablet Take 1 tablet (500 mg total) by mouth every 12 (twelve) hours for 7 days. 11/18/19 11/25/19  Alveria Apley, PA-C  ibuprofen (ADVIL,MOTRIN) 800 MG tablet Take 1 tablet (800 mg total) by mouth 3 (three) times daily. Patient not taking: Reported on 11/18/2019 07/02/14   Harvie Heck, PA-C  metroNIDAZOLE (FLAGYL) 500 MG tablet Take 1 tablet (500 mg total) by mouth 2 (two) times daily. 11/18/19   Madilyn Hook A, PA-C  Olopatadine HCl 0.2 % SOLN Apply 1 drop to eye daily. Patient not taking: Reported on 11/18/2019 03/19/18   Ok Edwards, PA-C  Polyethyl Glycol-Propyl Glycol (SYSTANE) 0.4-0.3 % GEL ophthalmic gel Place 1 application into both eyes at bedtime as needed. Patient not taking: Reported on 11/18/2019 03/19/18   Ok Edwards, PA-C  promethazine (PHENERGAN) 25 MG tablet Take 1 tablet (25 mg total) by mouth every 6 (six) hours as needed for nausea or vomiting. 11/18/19   Alveria Apley, PA-C    Allergies    Patient has no known allergies.  Review of Systems   Review of Systems  Constitutional: Negative for chills and fever.  HENT: Negative for ear pain and sore throat.   Eyes: Negative for pain  and visual disturbance.  Respiratory: Negative for cough and shortness of breath.   Cardiovascular: Negative for chest pain and palpitations.  Gastrointestinal: Positive for nausea and vomiting. Negative for abdominal pain.  Genitourinary: Negative for dysuria and hematuria.  Musculoskeletal: Negative for arthralgias and back pain.  Skin: Negative for color change and rash.  Neurological: Negative for seizures and syncope.  All other systems reviewed and are negative.   Physical Exam Updated Vital Signs BP (!) 147/100 (BP Location: Left Arm)   Pulse 69   Temp 98.6 F (37 C) (Oral)   Resp 18   SpO2 100%   Physical Exam Vitals and nursing note reviewed.  Constitutional:      Appearance:  He is well-developed.  HENT:     Head: Normocephalic and atraumatic.  Eyes:     Conjunctiva/sclera: Conjunctivae normal.  Cardiovascular:     Rate and Rhythm: Normal rate and regular rhythm.     Heart sounds: No murmur.  Pulmonary:     Effort: Pulmonary effort is normal. No respiratory distress.     Breath sounds: Normal breath sounds.  Abdominal:     Palpations: Abdomen is soft. There is no mass.     Tenderness: There is no guarding or rebound.     Comments: Mild tenderness in left lower quadrant, there is no rebound or guarding  Musculoskeletal:     Cervical back: Neck supple.  Skin:    General: Skin is warm and dry.  Neurological:     General: No focal deficit present.     Mental Status: He is alert.     ED Results / Procedures / Treatments   Labs (all labs ordered are listed, but only abnormal results are displayed) Labs Reviewed  CBC WITH DIFFERENTIAL/PLATELET - Abnormal; Notable for the following components:      Result Value   WBC 14.5 (*)    Neutro Abs 10.7 (*)    Monocytes Absolute 1.4 (*)    All other components within normal limits  COMPREHENSIVE METABOLIC PANEL - Abnormal; Notable for the following components:   Sodium 133 (*)    Potassium 3.2 (*)    Glucose, Bld 100 (*)    Calcium 8.7 (*)    All other components within normal limits  URINALYSIS, ROUTINE W REFLEX MICROSCOPIC - Abnormal; Notable for the following components:   Color, Urine AMBER (*)    Bilirubin Urine SMALL (*)    Ketones, ur >80 (*)    Protein, ur 30 (*)    All other components within normal limits  URINALYSIS, MICROSCOPIC (REFLEX) - Abnormal; Notable for the following components:   Bacteria, UA RARE (*)    All other components within normal limits  LIPASE, BLOOD    EKG None  Radiology CT Abdomen Pelvis W Contrast  Result Date: 11/18/2019 CLINICAL DATA:  Diverticulitis suspected, vomiting and left lower quadrant abdominal pain, denies fevers EXAM: CT ABDOMEN AND PELVIS WITH  CONTRAST TECHNIQUE: Multidetector CT imaging of the abdomen and pelvis was performed using the standard protocol following bolus administration of intravenous contrast. CONTRAST:  OMNIPAQUE IOHEXOL 300 MG/ML  SOLN COMPARISON:  None FINDINGS: Lower chest: Lung bases are clear. Normal heart size. No pericardial effusion. Hepatobiliary: No focal liver abnormality is seen. No gallstones, gallbladder wall thickening, or biliary dilatation. Pancreas: Unremarkable. No pancreatic ductal dilatation or surrounding inflammatory changes. Spleen: Normal in size without focal abnormality. Small accessory splenule. Adrenals/Urinary Tract: Normal adrenal glands. Kidneys are normally located with symmetric enhancement with mild early  excretion. No suspicious renal lesion, urolithiasis or hydronephrosis. Stomach/Bowel: Distal esophagus, stomach and duodenal sweep are unremarkable. No small bowel wall thickening or dilatation. No evidence of obstruction. A normal appendix is visualized. Proximal colon is unremarkable. There is some mild segmental thickening of the distal descending and sigmoid colon with faint Peri colonic haze. Few scattered colonic diverticula without focal inflammation to suggest acute diverticulitis. Vascular/Lymphatic: The aorta is normal caliber. No suspicious or enlarged lymph nodes in the included lymphatic chains. Reproductive: The prostate and seminal vesicles are unremarkable. Other: No abdominopelvic free fluid or free gas. No bowel containing hernias. Musculoskeletal: No acute osseous abnormality or suspicious osseous lesion. IMPRESSION: 1. Mild segmental thickening of the distal descending and sigmoid colon with faint pericolonic haze may represent a mild colitis of infectious or inflammatory etiology. 2. Few scattered colonic diverticula without focal inflammation to suggest acute diverticulitis. Electronically Signed   By: Kreg Shropshire M.D.   On: 11/18/2019 06:24    Procedures Ultrasound ED  Peripheral IV (Provider)  Date/Time: 11/19/2019 4:05 PM Performed by: Milagros Loll, MD Authorized by: Milagros Loll, MD   Procedure details:    Indications: hydration, multiple failed IV attempts and poor IV access     Skin Prep: chlorhexidine gluconate     Location:  Left anterior forearm   Angiocath:  20 G   Bedside Ultrasound Guided: Yes     Images: not archived     Patient tolerated procedure without complications: Yes     Dressing applied: Yes   Comments:     One stick, successful iv placement, images not saved due to urgent nature of IV placement   (including critical care time)  Medications Ordered in ED Medications  ondansetron (ZOFRAN) injection 4 mg (4 mg Intravenous Given 11/19/19 1122)  sodium chloride 0.9 % bolus 1,000 mL (0 mLs Intravenous Stopped 11/19/19 1335)  pantoprazole (PROTONIX) injection 40 mg (40 mg Intravenous Given 11/19/19 1122)  promethazine (PHENERGAN) injection 25 mg (25 mg Intravenous Given 11/19/19 1331)  metoCLOPramide (REGLAN) injection 5 mg (5 mg Intravenous Given 11/19/19 1543)    ED Course  I have reviewed the triage vital signs and the nursing notes.  Pertinent labs & imaging results that were available during my care of the patient were reviewed by me and considered in my medical decision making (see chart for details).  Clinical Course as of Nov 18 1604  Wed Nov 19, 2019  1141 Placed 20 gauge PIV in left forearm   [RD]    Clinical Course User Index [RD] Milagros Loll, MD   MDM Rules/Calculators/A&P                      34 year old male presenting to ER with worsening nausea, vomiting in setting of recently diagnosed colitis.  Patient is nontoxic, stable vital signs, initial plan to treat symptomatically.  Repeat labs notable for mild leukocytosis but otherwise negative.  Given multiple doses of antiemetics though he had some transient improvement, continue to have significant nausea and vomiting.  For symptomatic control, will  consult hospitalist for admission.  Dr. Renford Dills will admit.  Final Clinical Impression(s) / ED Diagnoses Final diagnoses:  Intractable nausea and vomiting  Leukocytosis, unspecified type    Rx / DC Orders ED Discharge Orders    None       Milagros Loll, MD 11/19/19 1606    Milagros Loll, MD 11/19/19 [phone]

## 2019-11-19 NOTE — Progress Notes (Signed)
Patient is a 34 year old male with no significant past medical history who presented yesterday at [facility] with nausea ,vomiting, abdominal pain.  He said he had onset of symptoms after he ate at [employer].  CT abdomen/pelvis showed mild  segmental thickening of the distal descending and sigmoid colon suggesting colitis.  He was discharged with Flagyl and Cipro but he came back to the emergency department at [facility] with persistent nausea and vomiting, inability to tolerate oral intake.  Patient being admitted for the management of colitis, intractable nausea and vomiting.  He was found to have mild hypokalemia along with leukocytosis. We should consider sending GI pathogen panel after he presents here.  At the  meantime, continue antibiotics,IV fluids, antiemetics

## 2019-11-19 NOTE — ED Notes (Signed)
Pt on monitor 

## 2019-11-19 NOTE — H&P (Addendum)
History and Physical    Brandom Kerwin ZOX:[id number] DOB: 02/10/1986 DOA: 11/19/2019  PCP: Jac Canavan, PA-C Patient coming from: [facility] ED  Chief Complaint: Abdominal pain, nausea, vomiting  HPI: Andre Gibbs is a 34 y.o. male with no significant past medical history presented to med [facility] ED yesterday with complaints of nausea, vomiting, and abdominal pain.   CT abdomen pelvis revealed mild segmental thickening of the distal descending and sigmoid colon suggesting mild colitis of infectious or inflammatory etiology.  He was discharged with Cipro and Flagyl but came back to the ED today with persistent nausea and vomiting, inability to tolerate p.o. intake.    Patient states he has been vomiting for the past 2 days and has not been able to keep any food down.  Having epigastric abdominal pain.  No GERD symptoms.  Denies fevers.  States his symptoms started after eating a burger at [employer].  He has not had any bowel movements for the past 2 days.  He has no other complaints.  Denies cough, shortness of breath, or chest pain.  ED Course: Vital signs stable on arrival.  Afebrile.  WBC count 14.5.  Potassium 3.2.  Lipase and LFTs normal.  UA not suggestive of infection.  SARS-CoV-2 PCR test negative.  Patient was given Reglan, Zofran, Protonix, Compazine, Phenergan, and 1 L normal saline bolus.  Review of Systems:  All systems reviewed and apart from history of presenting illness, are negative.  Past Medical History:  Diagnosis Date  . Allergy     History reviewed. No pertinent surgical history.   reports that he has been smoking. He has been smoking about 10.00 packs per day. He has never used smokeless tobacco. He reports current alcohol use of about 2.0 standard drinks of alcohol per week. He reports that he does not use drugs.  No Known Allergies  Family History  Problem Relation Age of Onset  . Stroke Mother        x3  . Hypertension Mother   . Other Father    history unknown  . Hypertension Maternal Grandmother   . Heart disease Maternal Grandmother   . Diabetes Maternal Grandmother   . Cancer Neg Hx     Prior to Admission medications   Medication Sig Start Date End Date Taking? Authorizing Provider  acetaminophen (TYLENOL) 500 MG tablet Take 500 mg by mouth every 6 (six) hours as needed for moderate pain.   Yes [provider]  ciprofloxacin (CIPRO) 500 MG tablet Take 1 tablet (500 mg total) by mouth every 12 (twelve) hours for 7 days. 11/18/19 11/25/19 Yes Ronnie Doss A, PA-C  metroNIDAZOLE (FLAGYL) 500 MG tablet Take 1 tablet (500 mg total) by mouth 2 (two) times daily. 11/18/19  Yes Arlyn Dunning, PA-C  promethazine (PHENERGAN) 25 MG tablet Take 1 tablet (25 mg total) by mouth every 6 (six) hours as needed for nausea or vomiting. 11/18/19  Yes Ronnie Doss A, PA-C  ibuprofen (ADVIL,MOTRIN) 800 MG tablet Take 1 tablet (800 mg total) by mouth 3 (three) times daily. Patient not taking: Reported on 11/18/2019 07/02/14   Mellody Drown, PA-C  Olopatadine HCl 0.2 % SOLN Apply 1 drop to eye daily. Patient not taking: Reported on 11/18/2019 03/19/18   Belinda Fisher, PA-C  Polyethyl Glycol-Propyl Glycol (SYSTANE) 0.4-0.3 % GEL ophthalmic gel Place 1 application into both eyes at bedtime as needed. Patient not taking: Reported on 11/18/2019 03/19/18   Belinda Fisher, PA-C  Physical Exam: Vitals:   11/19/19 1534 11/19/19 1800 11/19/19 2021 11/19/19 2106  BP: (!) 147/100 (!) 140/92 (!) 154/89 (!) 126/110  Pulse: 69 62 82 69  Resp: 18 (!) 21 16 18   Temp:   99.1 F (37.3 C) 98.5 F (36.9 C)  TempSrc:   Oral Oral  SpO2: 100% 97% 99% 100%    Physical Exam  Constitutional: He is oriented to person, place, and time. He appears well-developed and well-nourished. No distress.  HENT:  Head: Normocephalic.  Dry mucous membranes  Eyes: Right eye exhibits no discharge. Left eye exhibits no discharge.  Cardiovascular: Normal rate, regular rhythm and intact  distal pulses.  Pulmonary/Chest: Effort normal and breath sounds normal. No respiratory distress. He has no wheezes. He has no rales.  Abdominal: Soft. Bowel sounds are normal. He exhibits no distension. There is abdominal tenderness. There is no rebound and no guarding.  Epigastrium mildly tender to palpation  Musculoskeletal:        General: No edema.     Cervical back: Neck supple.  Neurological: He is alert and oriented to person, place, and time.  Skin: Skin is warm and dry. He is not diaphoretic.    Labs on Admission: I have personally reviewed following labs and imaging studies  CBC: Recent Labs  Lab 11/18/19 0152 11/19/19 1135  WBC 8.6 14.5*  NEUTROABS  --  10.7*  HGB 15.3 14.5  HCT 45.0 42.8  MCV 88.9 86.5  PLT 261 626   Basic Metabolic Panel: Recent Labs  Lab 11/18/19 0152 11/19/19 1135  NA 139 133*  K 3.4* 3.2*  CL 105 100  CO2 25 25  GLUCOSE 107* 100*  BUN 18 17  CREATININE 1.01 0.93  CALCIUM 9.3 8.7*   GFR: Estimated Creatinine Clearance: 163.6 mL/min (by C-G formula based on SCr of 0.93 mg/dL). Liver Function Tests: Recent Labs  Lab 11/18/19 0152 11/19/19 1135  AST 27 20  ALT 29 24  ALKPHOS 54 52  BILITOT 0.5 0.6  PROT 8.4* 7.4  ALBUMIN 4.6 3.8   Recent Labs  Lab 11/18/19 0152 11/19/19 1135  LIPASE 50 25   No results for input(s): AMMONIA in the last 168 hours. Coagulation Profile: No results for input(s): INR, PROTIME in the last 168 hours. Cardiac Enzymes: No results for input(s): CKTOTAL, CKMB, CKMBINDEX, TROPONINI in the last 168 hours. BNP (last 3 results) No results for input(s): PROBNP in the last 8760 hours. HbA1C: No results for input(s): HGBA1C in the last 72 hours. CBG: Recent Labs  Lab 11/18/19 0341  GLUCAP 97   Lipid Profile: No results for input(s): CHOL, HDL, LDLCALC, TRIG, CHOLHDL, LDLDIRECT in the last 72 hours. Thyroid Function Tests: No results for input(s): TSH, T4TOTAL, FREET4, T3FREE, THYROIDAB in the  last 72 hours. Anemia Panel: No results for input(s): VITAMINB12, FOLATE, FERRITIN, TIBC, IRON, RETICCTPCT in the last 72 hours. Urine analysis:    Component Value Date/Time   COLORURINE AMBER (A) 11/19/2019 1125   APPEARANCEUR CLEAR 11/19/2019 1125   LABSPEC 1.015 11/19/2019 1125   LABSPEC 1.020 03/29/2017 1435   PHURINE 7.5 11/19/2019 1125   GLUCOSEU NEGATIVE 11/19/2019 1125   HGBUR NEGATIVE 11/19/2019 1125   BILIRUBINUR SMALL (A) 11/19/2019 1125   BILIRUBINUR negative 03/29/2017 1435   KETONESUR >80 (A) 11/19/2019 1125   PROTEINUR 30 (A) 11/19/2019 1125   NITRITE NEGATIVE 11/19/2019 1125   LEUKOCYTESUR NEGATIVE 11/19/2019 1125    Radiological Exams on Admission: CT Abdomen Pelvis W Contrast  Result Date:  11/18/2019 CLINICAL DATA:  Diverticulitis suspected, vomiting and left lower quadrant abdominal pain, denies fevers EXAM: CT ABDOMEN AND PELVIS WITH CONTRAST TECHNIQUE: Multidetector CT imaging of the abdomen and pelvis was performed using the standard protocol following bolus administration of intravenous contrast. CONTRAST:  OMNIPAQUE IOHEXOL 300 MG/ML  SOLN COMPARISON:  None FINDINGS: Lower chest: Lung bases are clear. Normal heart size. No pericardial effusion. Hepatobiliary: No focal liver abnormality is seen. No gallstones, gallbladder wall thickening, or biliary dilatation. Pancreas: Unremarkable. No pancreatic ductal dilatation or surrounding inflammatory changes. Spleen: Normal in size without focal abnormality. Small accessory splenule. Adrenals/Urinary Tract: Normal adrenal glands. Kidneys are normally located with symmetric enhancement with mild early excretion. No suspicious renal lesion, urolithiasis or hydronephrosis. Stomach/Bowel: Distal esophagus, stomach and duodenal sweep are unremarkable. No small bowel wall thickening or dilatation. No evidence of obstruction. A normal appendix is visualized. Proximal colon is unremarkable. There is some mild segmental thickening  of the distal descending and sigmoid colon with faint Peri colonic haze. Few scattered colonic diverticula without focal inflammation to suggest acute diverticulitis. Vascular/Lymphatic: The aorta is normal caliber. No suspicious or enlarged lymph nodes in the included lymphatic chains. Reproductive: The prostate and seminal vesicles are unremarkable. Other: No abdominopelvic free fluid or free gas. No bowel containing hernias. Musculoskeletal: No acute osseous abnormality or suspicious osseous lesion. IMPRESSION: 1. Mild segmental thickening of the distal descending and sigmoid colon with faint pericolonic haze may represent a mild colitis of infectious or inflammatory etiology. 2. Few scattered colonic diverticula without focal inflammation to suggest acute diverticulitis. Electronically Signed   By: Kreg Shropshire M.D.   On: 11/18/2019 06:24    EKG: Independently reviewed.  Sinus rhythm, nonspecific T wave abnormality.  No significant change since prior tracing.  Assessment/Plan Principal Problem:   Intractable nausea and vomiting Active Problems:   Colitis   Hypokalemia   Tobacco abuse   Intractable nausea and vomiting secondary to mild colitis: Labs showing mild leukocytosis.  No signs of sepsis. CT abdomen pelvis revealed mild segmental thickening of the distal descending and sigmoid colon suggesting mild colitis of infectious or inflammatory etiology.  -Ceftriaxone and Flagyl.  Continue IV fluid hydration.  Antiemetic as needed.  Will hold off ordering stool studies as patient reports having no bowel movements for the past 2 days.  Continue to monitor WBC count.  Mild hypokalemia: Replete potassium.  Check magnesium level and replete if low.  Continue to monitor electrolytes.  Tobacco use: NicoDerm patch and counseling.  HIV screening: The patient falls between the ages of 13-64 and should be screened for HIV, therefore HIV testing ordered.  DVT prophylaxis: Lovenox Code Status: Full  code Family Communication: No family available at this time. Disposition Plan: Status is: Inpatient  Remains inpatient appropriate because:IV treatments appropriate due to intensity of illness or inability to take PO   Dispo: The patient is from: Home              Anticipated d/c is to: Home              Anticipated d/c date is: 2 days              Patient currently is not medically stable to d/c.  The medical decision making on this patient was of high complexity and the patient is at high risk for clinical deterioration, therefore this is a level 3 visit.  John Giovanni MD [facility]  If 7PM-7AM, please contact night-coverage [email or web address]  11/19/2019, 10:22  PM

## 2019-11-19 NOTE — ED Triage Notes (Signed)
Pt here after being seen at [facility] yesterday for emesis and dizziness. Sent with Rx for antibiotics and phenergan. States he can't keep meds down and that nausea meds seem to exacerbate emesis.

## 2019-11-20 LAB — CBC
HCT: 43.8 % (ref 39.0–52.0)
Hemoglobin: 14.5 g/dL (ref 13.0–17.0)
MCH: 29.9 pg (ref 26.0–34.0)
MCHC: 33.1 g/dL (ref 30.0–36.0)
MCV: 90.3 fL (ref 80.0–100.0)
Platelets: 287 10*3/uL (ref 150–400)
RBC: 4.85 MIL/uL (ref 4.22–5.81)
RDW: 11.7 % (ref 11.5–15.5)
WBC: 12.5 10*3/uL — ABNORMAL HIGH (ref 4.0–10.5)
nRBC: 0 % (ref 0.0–0.2)

## 2019-11-20 LAB — BASIC METABOLIC PANEL
Anion gap: 13 (ref 5–15)
BUN: 19 mg/dL (ref 6–20)
CO2: 21 mmol/L — ABNORMAL LOW (ref 22–32)
Calcium: 8.6 mg/dL — ABNORMAL LOW (ref 8.9–10.3)
Chloride: 102 mmol/L (ref 98–111)
Creatinine, Ser: 1.01 mg/dL (ref 0.61–1.24)
GFR calc Af Amer: >60 mL/min (ref >60)
GFR calc non Af Amer: >60 mL/min (ref >60)
Glucose, Bld: 88 mg/dL (ref 70–99)
Potassium: 3.6 mmol/L (ref 3.5–5.1)
Sodium: 136 mmol/L (ref 135–145)

## 2019-11-20 LAB — HIV ANTIBODY (ROUTINE TESTING W REFLEX): HIV Screen 4th Generation wRfx: NONREACTIVE

## 2019-11-20 MED ORDER — SODIUM CHLORIDE 0.9 % IV SOLN: INTRAVENOUS | Status: DC

## 2019-11-20 MED ORDER — PROCHLORPERAZINE EDISYLATE 10 MG/2ML IJ SOLN
10.0000 mg | Freq: Four times a day (QID) | INTRAMUSCULAR | Status: DC | PRN
  Administered 2019-11-20 (×2): 10 mg via INTRAVENOUS
  Filled 2019-11-20 (×2): qty 2

## 2019-11-20 MED ORDER — PROMETHAZINE HCL 25 MG/ML IJ SOLN
25.0000 mg | Freq: Four times a day (QID) | INTRAMUSCULAR | Status: DC | PRN
  Administered 2019-11-20 (×2): 25 mg via INTRAVENOUS
  Filled 2019-11-20: qty 1

## 2019-11-20 MED ORDER — KETOROLAC TROMETHAMINE 30 MG/ML IJ SOLN
30.0000 mg | Freq: Once | INTRAMUSCULAR | Status: AC
  Administered 2019-11-20: 06:00:00 30 mg via INTRAVENOUS
  Filled 2019-11-20: qty 1

## 2019-11-20 MED ORDER — PROCHLORPERAZINE EDISYLATE 10 MG/2ML IJ SOLN: 10.0000 mg | Freq: Four times a day (QID) | INTRAMUSCULAR | Status: DC | PRN

## 2019-11-20 MED ORDER — PROMETHAZINE HCL 25 MG/ML IJ SOLN
25.0000 mg | Freq: Four times a day (QID) | INTRAMUSCULAR | Status: DC | PRN
  Filled 2019-11-20: qty 1

## 2019-11-20 NOTE — Progress Notes (Signed)
PROGRESS NOTE    Andre Gibbs  RKY:[id number] DOB: 1986/06/16 DOA: 11/19/2019 PCP: Jac Canavan, PA-C   Brief Narrative: Patient is a 34 year old male with no significant past medical history who presented yesterday at [facility] with nausea ,vomiting, abdominal pain.  He said he had onset of symptoms after he ate at [employer].  CT abdomen/pelvis showed mild  segmental thickening of the distal descending and sigmoid colon suggesting colitis.  He was discharged with Flagyl and Cipro but he came back to the emergency department at [facility] with persistent nausea and vomiting, inability to tolerate oral intake.  Patient also reported daily use of marijuana. Patient being admitted for the management of colitis, intractable nausea and vomiting.  He was found to have mild hypokalemia along with leukocytosis.  Assessment & Plan:   Principal Problem:   Intractable nausea and vomiting Active Problems:   Colitis   Hypokalemia   Tobacco abuse   Intractable nausea/vomiting: Currently n.p.o.  Continue IV fluids, antiemetics.  Still has significant ongoing nausea and vomiting this morning.  Denies any abdominal pain  Colitis: Presented with abdominal pain, nausea, vomiting after eating at [employer].  CT abdomen showed mild segmental thickening of the distal descending and sigmoid colon persisting pancolitis of inflammatory or infectious etiology.  Started on ceftriaxone and Flagyl.  Currently not having bowel movement ,so GI pathogen panel sent.  Cyclic vomiting syndrome/marijuana use: Patient reported daily marijuana use.  Patient symptoms could also be associated with cyclic vomiting syndrome associated  with marijuana.  Counseled for cessation  Leukocytosis: Improving.  Continue to monitor  Hypokalemia: Continue to monitor and replete           DVT prophylaxis:Lovenox Code Status: Full code Family Communication:  Status is: Inpatient  Remains inpatient  appropriate because:IV treatments appropriate due to intensity of illness or inability to take PO   Dispo: The patient is from: Home              Anticipated d/c is to: Home              Anticipated d/c date is:1-2 days              Patient currently is not medically stable to d/c.   Consultants: None  Procedures:None  Antimicrobials:  Anti-infectives (From admission, onward)   Start     Dose/Rate Route Frequency Ordered Stop   11/19/19 2230  cefTRIAXone (ROCEPHIN) 2 g in sodium chloride 0.9 % 100 mL IVPB     2 g 200 mL/hr over 30 Minutes Intravenous Daily at bedtime 11/19/19 2220     11/19/19 2230  metroNIDAZOLE (FLAGYL) IVPB 500 mg     500 mg 100 mL/hr over 60 Minutes Intravenous Every 8 hours 11/19/19 2220        Subjective: Patient seen and examined at the bedside this morning.  Hemodynamically stable.  He was still having severe nausea and was vomiting during my evaluation.  Wife was at the bedside.  Denies any significant abdominal pain.  No diarrhea  Objective: Vitals:   11/19/19 2106 11/19/19 2345 11/20/19 0118 11/20/19 0523  BP: (!) 126/110 133/73 130/70 (!) 160/100  Pulse: 69  75 64  Resp: 18  18 (!) 21  Temp: 98.5 F (36.9 C)  97.9 F (36.6 C) 99.3 F (37.4 C)  TempSrc: Oral  Oral Oral  SpO2: 100%  98% 100%    Intake/Output Summary (Last 24 hours) at 11/20/2019 0756 Last data filed at  11/20/2019 0600 Gross per 24 hour  Intake 2336.73 ml  Output --  Net 2336.73 ml   There were no vitals filed for this visit.  Examination:  General exam: In  Moderate distress due to vomiting/nausea,obese HEENT:PERRL,Oral mucosa moist, Ear/Nose normal on gross exam Respiratory system: Bilateral equal air entry, normal vesicular breath sounds, no wheezes or crackles  Cardiovascular system: S1 & S2 heard, RRR. No JVD, murmurs, rubs, gallops or clicks. No pedal edema. Gastrointestinal system: Abdomen is nondistended, soft and nontender. No organomegaly or masses felt. Normal  bowel sounds heard. Central nervous system: Alert and oriented. No focal neurological deficits. Extremities: No edema, no clubbing ,no cyanosis, distal peripheral pulses palpable. Skin: No rashes, lesions or ulcers,no icterus ,no pallor   Data Reviewed: I have personally reviewed following labs and imaging studies  CBC: Recent Labs  Lab 11/18/19 0152 11/19/19 1135 11/20/19 0555  WBC 8.6 14.5* 12.5*  NEUTROABS  --  10.7*  --   HGB 15.3 14.5 14.5  HCT 45.0 42.8 43.8  MCV 88.9 86.5 90.3  PLT 261 303 287   Basic Metabolic Panel: Recent Labs  Lab 11/18/19 0152 11/19/19 1135 11/19/19 2241  NA 139 133*  --   K 3.4* 3.2*  --   CL 105 100  --   CO2 25 25  --   GLUCOSE 107* 100*  --   BUN 18 17  --   CREATININE 1.01 0.93  --   CALCIUM 9.3 8.7*  --   MG  --   --  1.8   GFR: Estimated Creatinine Clearance: 163.6 mL/min (by C-G formula based on SCr of 0.93 mg/dL). Liver Function Tests: Recent Labs  Lab 11/18/19 0152 11/19/19 1135  AST 27 20  ALT 29 24  ALKPHOS 54 52  BILITOT 0.5 0.6  PROT 8.4* 7.4  ALBUMIN 4.6 3.8   Recent Labs  Lab 11/18/19 0152 11/19/19 1135  LIPASE 50 25   No results for input(s): AMMONIA in the last 168 hours. Coagulation Profile: No results for input(s): INR, PROTIME in the last 168 hours. Cardiac Enzymes: No results for input(s): CKTOTAL, CKMB, CKMBINDEX, TROPONINI in the last 168 hours. BNP (last 3 results) No results for input(s): PROBNP in the last 8760 hours. HbA1C: No results for input(s): HGBA1C in the last 72 hours. CBG: Recent Labs  Lab 11/18/19 0341  GLUCAP 97   Lipid Profile: No results for input(s): CHOL, HDL, LDLCALC, TRIG, CHOLHDL, LDLDIRECT in the last 72 hours. Thyroid Function Tests: No results for input(s): TSH, T4TOTAL, FREET4, T3FREE, THYROIDAB in the last 72 hours. Anemia Panel: No results for input(s): VITAMINB12, FOLATE, FERRITIN, TIBC, IRON, RETICCTPCT in the last 72 hours. Sepsis Labs: No results for  input(s): PROCALCITON, LATICACIDVEN in the last 168 hours.  Recent Results (from the past 240 hour(s))  Urine culture     Status: None   Collection Time: 11/18/19  5:03 AM   Specimen: Urine, Random  Result Value Ref Range Status   Specimen Description   Final    URINE, RANDOM Performed at [facility], [address], [place], [place] [address]    Special Requests   Final    NONE Performed at [facility], [address], [place], [place] [address]    Culture   Final    NO GROWTH Performed at [facility], [address], [place], [place] [address]    Report Status 11/19/2019 FINAL  Final  Respiratory Panel by RT PCR (  Flu A&B, Covid) - Nasopharyngeal Swab     Status: None   Collection Time: 11/19/19  6:07 PM   Specimen: Nasopharyngeal Swab  Result Value Ref Range Status   SARS Coronavirus 2 by RT PCR NEGATIVE NEGATIVE Final    Comment: (NOTE) SARS-CoV-2 target nucleic acids are NOT DETECTED. The SARS-CoV-2 RNA is generally detectable in upper respiratoy specimens during the acute phase of infection. The lowest concentration of SARS-CoV-2 viral copies this assay can detect is 131 copies/mL. A negative result does not preclude SARS-Cov-2 infection and should not be used as the sole basis for treatment or other patient management decisions. A negative result may occur with  improper specimen collection/handling, submission of specimen other than nasopharyngeal swab, presence of viral mutation(s) within the areas targeted by this assay, and inadequate number of viral copies (<131 copies/mL). A negative result must be combined with clinical observations, patient history, and epidemiological information. The expected result is Negative. Fact Sheet for Patients:  PinkCheek.be Fact Sheet for Healthcare Providers:  GravelBags.it This test is not yet ap proved or cleared by the [place] FDA and  has been authorized for detection and/or diagnosis of SARS-CoV-2 by FDA under an Emergency Use Authorization (EUA). This EUA will remain  in effect (meaning this test can be used) for the duration of the COVID-19 declaration under Section 564(b)(1) of the Act, 21 U.S.C. section 360bbb-3(b)(1), unless the authorization is terminated or revoked sooner.    Influenza A by PCR NEGATIVE NEGATIVE Final   Influenza B by PCR NEGATIVE NEGATIVE Final    Comment: (NOTE) The Xpert Xpress SARS-CoV-2/FLU/RSV assay is intended as an aid in  the diagnosis of influenza from Nasopharyngeal swab specimens and  should not be used as a sole basis for treatment. Nasal washings and  aspirates are unacceptable for Xpert Xpress SARS-CoV-2/FLU/RSV  testing. Fact Sheet for Patients: PinkCheek.be Fact Sheet for Healthcare Providers: GravelBags.it This test is not yet approved or cleared by the [place] FDA and  has been authorized for detection and/or diagnosis of SARS-CoV-2 by  FDA under an Emergency Use Authorization (EUA). This EUA will remain  in effect (meaning this test can be used) for the duration of the  Covid-19 declaration under Section 564(b)(1) of the Act, 21  U.S.C. section 360bbb-3(b)(1), unless the authorization is  terminated or revoked. Performed at [facility], [address], [place], [place] [address]          Radiology Studies: No results found.      Scheduled Meds: . enoxaparin (LOVENOX) injection  60 mg Subcutaneous QHS  . nicotine  14 mg Transdermal Daily   Continuous Infusions: . sodium chloride 150 mL/hr at 11/19/19 2300  . cefTRIAXone (ROCEPHIN)  IV 2 g (11/19/19 2306)  . metronidazole 500 mg (11/20/19 0527)     LOS: 1 day    Time spent: 35 mins,More than 50% of that time was spent in counseling and/or coordination of care.      Shelly Coss, MD [facility] P5/12/2019, 7:56 AM

## 2019-11-21 LAB — BASIC METABOLIC PANEL
Anion gap: 11 (ref 5–15)
BUN: 13 mg/dL (ref 6–20)
CO2: 21 mmol/L — ABNORMAL LOW (ref 22–32)
Calcium: 8.3 mg/dL — ABNORMAL LOW (ref 8.9–10.3)
Chloride: 102 mmol/L (ref 98–111)
Creatinine, Ser: 0.83 mg/dL (ref 0.61–1.24)
GFR calc Af Amer: >60 mL/min (ref >60)
GFR calc non Af Amer: >60 mL/min (ref >60)
Glucose, Bld: 83 mg/dL (ref 70–99)
Potassium: 3.5 mmol/L (ref 3.5–5.1)
Sodium: 134 mmol/L — ABNORMAL LOW (ref 135–145)

## 2019-11-21 LAB — CBC WITH DIFFERENTIAL/PLATELET
Abs Immature Granulocytes: 0.13 10*3/uL — ABNORMAL HIGH (ref 0.00–0.07)
Basophils Absolute: 0 10*3/uL (ref 0.0–0.1)
Basophils Relative: 0 %
Eosinophils Absolute: 0 10*3/uL (ref 0.0–0.5)
Eosinophils Relative: 0 %
HCT: 43 % (ref 39.0–52.0)
Hemoglobin: 14.7 g/dL (ref 13.0–17.0)
Immature Granulocytes: 1 %
Lymphocytes Relative: 18 %
Lymphs Abs: 2.3 10*3/uL (ref 0.7–4.0)
MCH: 30.1 pg (ref 26.0–34.0)
MCHC: 34.2 g/dL (ref 30.0–36.0)
MCV: 87.9 fL (ref 80.0–100.0)
Monocytes Absolute: 1.2 10*3/uL — ABNORMAL HIGH (ref 0.1–1.0)
Monocytes Relative: 9 %
Neutro Abs: 9 10*3/uL — ABNORMAL HIGH (ref 1.7–7.7)
Neutrophils Relative %: 72 %
Platelets: 261 10*3/uL (ref 150–400)
RBC: 4.89 MIL/uL (ref 4.22–5.81)
RDW: 11.5 % (ref 11.5–15.5)
WBC: 12.7 10*3/uL — ABNORMAL HIGH (ref 4.0–10.5)
nRBC: 0 % (ref 0.0–0.2)

## 2019-11-21 MED ORDER — DIPHENHYDRAMINE HCL 50 MG/ML IJ SOLN
12.5000 mg | Freq: Every evening | INTRAMUSCULAR | Status: DC | PRN
  Administered 2019-11-21: 12.5 mg via INTRAVENOUS
  Filled 2019-11-21: qty 1

## 2019-11-21 MED ORDER — ALUM & MAG HYDROXIDE-SIMETH 200-200-20 MG/5ML PO SUSP
30.0000 mL | ORAL | Status: DC | PRN
  Administered 2019-11-21: 30 mL via ORAL
  Filled 2019-11-21: qty 30

## 2019-11-21 MED ORDER — POTASSIUM CHLORIDE 20 MEQ PO PACK
40.0000 meq | PACK | Freq: Once | ORAL | Status: AC
  Administered 2019-11-21: 13:00:00 40 meq via ORAL
  Filled 2019-11-21: qty 2

## 2019-11-21 NOTE — Progress Notes (Signed)
PROGRESS NOTE    Andre Gibbs  QVZ:[id number] DOB: 07-27-1985 DOA: 11/19/2019 PCP: Jac Canavan, PA-C   Brief Narrative: Patient is a 34 year old male with no significant past medical history who presented yesterday at [facility] with nausea ,vomiting, abdominal pain.  He said he had onset of symptoms after he ate at [employer].  CT abdomen/pelvis showed mild  segmental thickening of the distal descending and sigmoid colon suggesting colitis.  He was discharged with Flagyl and Cipro but he came back to the emergency department at [facility] with persistent nausea and vomiting, inability to tolerate oral intake.  Patient also reported daily use of marijuana. Patient being admitted for the management of colitis, intractable nausea and vomiting.  He was found to have mild hypokalemia along with leukocytosis. Nausea and vomiting have improved today.  Assessment & Plan:   Principal Problem:   Intractable nausea and vomiting Active Problems:   Colitis   Hypokalemia   Tobacco abuse   Intractable nausea/vomiting: Improved today.  Continue IV fluids, antiemetics.  .  Denies any abdominal pain.  Start on clear liquid diet,advance diet gradually.  Colitis: Presented with abdominal pain, nausea, vomiting after eating at [employer].  CT abdomen showed mild segmental thickening of the distal descending and sigmoid colon persisting pancolitis of inflammatory or infectious etiology.  Started on ceftriaxone and Flagyl.  Currently not having bowel movement ,so GI pathogen panel sent.  Cyclic vomiting syndrome/marijuana use: Patient reported daily marijuana use.  Patient symptoms could also be associated with cyclic vomiting syndrome associated  with marijuana.  Counseled for cessation  Leukocytosis: Improving.  Continue to monitor  Hypokalemia: Continue to monitor and replete           DVT prophylaxis:Lovenox Code Status: Full code Family Communication: Wife at bedside on  11/20/19 Status is: Inpatient  Remains inpatient appropriate because:IV treatments appropriate due to intensity of illness or inability to take PO   Dispo: The patient is from: Home              Anticipated d/c is to: Home              Anticipated d/c date is:1-2 days              Patient currently is not medically stable to d/c.   Consultants: None  Procedures:None  Antimicrobials:  Anti-infectives (From admission, onward)   Start     Dose/Rate Route Frequency Ordered Stop   11/19/19 2230  cefTRIAXone (ROCEPHIN) 2 g in sodium chloride 0.9 % 100 mL IVPB     2 g 200 mL/hr over 30 Minutes Intravenous Daily at bedtime 11/19/19 2220     11/19/19 2230  metroNIDAZOLE (FLAGYL) IVPB 500 mg     500 mg 100 mL/hr over 60 Minutes Intravenous Every 8 hours 11/19/19 2220        Subjective: Patient seen and examined at bedside this morning.  Currently stable.  Nausea and vomiting have improved today but he is still feeling some.  Denies any abdominal pain.  He wants to advance the diet to clear liquid.   Objective: Vitals:   11/20/19 1355 11/20/19 1512 11/20/19 2134 11/21/19 0618  BP: (!) 145/85  (!) 151/77 (!) 144/75  Pulse: (!) 59  77 77  Resp: 18  18 18   Temp: 98.3 F (36.8 C)  98.9 F (37.2 C) 98.7 F (37.1 C)  TempSrc: Oral  Oral Oral  SpO2: 100%  100% 99%  Weight:  121.1  kg    Height:  6\' 4"  (1.93 m)      Intake/Output Summary (Last 24 hours) at 11/21/2019 01/21/2020 Last data filed at 11/21/2019 0400 Gross per 24 hour  Intake 1570.34 ml  Output --  Net 1570.34 ml   Filed Weights   11/20/19 1512  Weight: 121.1 kg    Examination:  General exam: comfortable,obese Respiratory system: Bilateral equal air entry, normal vesicular breath sounds, no wheezes or crackles  Cardiovascular system: S1 & S2 heard, RRR. No JVD, murmurs, rubs, gallops or clicks. Gastrointestinal system: Abdomen is nondistended, soft and nontender. No organomegaly or masses felt. Normal bowel sounds  heard. Central nervous system: Alert and oriented. No focal neurological deficits. Extremities: No edema, no clubbing ,no cyanosis, distal peripheral pulses palpable. Skin: No rashes, lesions or ulcers,no icterus ,no pallor   Data Reviewed: I have personally reviewed following labs and imaging studies  CBC: Recent Labs  Lab 11/18/19 0152 11/19/19 1135 11/20/19 0555 11/21/19 0534  WBC 8.6 14.5* 12.5* 12.7*  NEUTROABS  --  10.7*  --  9.0*  HGB 15.3 14.5 14.5 14.7  HCT 45.0 42.8 43.8 43.0  MCV 88.9 86.5 90.3 87.9  PLT 261 303 287 261   Basic Metabolic Panel: Recent Labs  Lab 11/18/19 0152 11/19/19 1135 11/19/19 2241 11/20/19 0555 11/21/19 0534  NA 139 133*  --  136 134*  K 3.4* 3.2*  --  3.6 3.5  CL 105 100  --  102 102  CO2 25 25  --  21* 21*  GLUCOSE 107* 100*  --  88 83  BUN 18 17  --  19 13  CREATININE 1.01 0.93  --  1.01 0.83  CALCIUM 9.3 8.7*  --  8.6* 8.3*  MG  --   --  1.8  --   --    GFR: Estimated Creatinine Clearance: 183.3 mL/min (by C-G formula based on SCr of 0.83 mg/dL). Liver Function Tests: Recent Labs  Lab 11/18/19 0152 11/19/19 1135  AST 27 20  ALT 29 24  ALKPHOS 54 52  BILITOT 0.5 0.6  PROT 8.4* 7.4  ALBUMIN 4.6 3.8   Recent Labs  Lab 11/18/19 0152 11/19/19 1135  LIPASE 50 25   No results for input(s): AMMONIA in the last 168 hours. Coagulation Profile: No results for input(s): INR, PROTIME in the last 168 hours. Cardiac Enzymes: No results for input(s): CKTOTAL, CKMB, CKMBINDEX, TROPONINI in the last 168 hours. BNP (last 3 results) No results for input(s): PROBNP in the last 8760 hours. HbA1C: No results for input(s): HGBA1C in the last 72 hours. CBG: Recent Labs  Lab 11/18/19 0341  GLUCAP 97   Lipid Profile: No results for input(s): CHOL, HDL, LDLCALC, TRIG, CHOLHDL, LDLDIRECT in the last 72 hours. Thyroid Function Tests: No results for input(s): TSH, T4TOTAL, FREET4, T3FREE, THYROIDAB in the last 72 hours. Anemia  Panel: No results for input(s): VITAMINB12, FOLATE, FERRITIN, TIBC, IRON, RETICCTPCT in the last 72 hours. Sepsis Labs: No results for input(s): PROCALCITON, LATICACIDVEN in the last 168 hours.  Recent Results (from the past 240 hour(s))  Urine culture     Status: None   Collection Time: 11/18/19  5:03 AM   Specimen: Urine, Random  Result Value Ref Range Status   Specimen Description   Final    URINE, RANDOM Performed at [facility], [address], [place], [place]    Special Requests   Final    NONE Performed at [facility], [place] [address], [place], [place] [address]    Culture   Final    NO GROWTH Performed at [facility], [place] [address] [place], [place] [address]    Report Status 11/19/2019 FINAL  Final  Respiratory Panel by RT PCR (Flu A&B, Covid) - Nasopharyngeal Swab     Status: None   Collection Time: 11/19/19  6:07 PM   Specimen: Nasopharyngeal Swab  Result Value Ref Range Status   SARS Coronavirus 2 by RT PCR NEGATIVE NEGATIVE Final    Comment: (NOTE) SARS-CoV-2 target nucleic acids are NOT DETECTED. The SARS-CoV-2 RNA is generally detectable in upper respiratoy specimens during the acute phase of infection. The lowest concentration of SARS-CoV-2 viral copies this assay can detect is 131 copies/mL. A negative result does not preclude SARS-Cov-2 infection and should not be used as the sole basis for treatment or other patient management decisions. A negative result may occur with  improper specimen collection/handling, submission of specimen other than nasopharyngeal swab, presence of viral mutation(s) within the areas targeted by this assay, and inadequate number of viral copies (<131 copies/mL). A negative result must be combined with clinical observations, patient history, and epidemiological information. The expected result is Negative. Fact Sheet for Patients:   PinkCheek.be Fact Sheet for Healthcare Providers:  GravelBags.it This test is not yet ap proved or cleared by the [place] FDA and  has been authorized for detection and/or diagnosis of SARS-CoV-2 by FDA under an Emergency Use Authorization (EUA). This EUA will remain  in effect (meaning this test can be used) for the duration of the COVID-19 declaration under Section 564(b)(1) of the Act, 21 U.S.C. section 360bbb-3(b)(1), unless the authorization is terminated or revoked sooner.    Influenza A by PCR NEGATIVE NEGATIVE Final   Influenza B by PCR NEGATIVE NEGATIVE Final    Comment: (NOTE) The Xpert Xpress SARS-CoV-2/FLU/RSV assay is intended as an aid in  the diagnosis of influenza from Nasopharyngeal swab specimens and  should not be used as a sole basis for treatment. Nasal washings and  aspirates are unacceptable for Xpert Xpress SARS-CoV-2/FLU/RSV  testing. Fact Sheet for Patients: PinkCheek.be Fact Sheet for Healthcare Providers: GravelBags.it This test is not yet approved or cleared by the [place] FDA and  has been authorized for detection and/or diagnosis of SARS-CoV-2 by  FDA under an Emergency Use Authorization (EUA). This EUA will remain  in effect (meaning this test can be used) for the duration of the  Covid-19 declaration under Section 564(b)(1) of the Act, 21  U.S.C. section 360bbb-3(b)(1), unless the authorization is  terminated or revoked. Performed at [facility], [address] [place], [place] [address]          Radiology Studies: No results found.      Scheduled Meds: . enoxaparin (LOVENOX) injection  60 mg Subcutaneous QHS  . nicotine  14 mg Transdermal Daily   Continuous Infusions: . sodium chloride 125 mL/hr at 11/21/19 0400  . cefTRIAXone (ROCEPHIN)  IV Stopped (11/20/19 2346)  . metronidazole 500 mg  (11/21/19 0624)     LOS: 2 days    Time spent: 35 mins,More than 50% of that time was spent in counseling and/or coordination of care.      Shelly Coss, MD [facility] P5/01/2020, 8:12 AM

## 2019-11-22 LAB — BASIC METABOLIC PANEL
Anion gap: 12 (ref 5–15)
BUN: 11 mg/dL (ref 6–20)
CO2: 21 mmol/L — ABNORMAL LOW (ref 22–32)
Calcium: 8.7 mg/dL — ABNORMAL LOW (ref 8.9–10.3)
Chloride: 100 mmol/L (ref 98–111)
Creatinine, Ser: 0.8 mg/dL (ref 0.61–1.24)
GFR calc Af Amer: >60 mL/min (ref >60)
GFR calc non Af Amer: >60 mL/min (ref >60)
Glucose, Bld: 81 mg/dL (ref 70–99)
Potassium: 3.2 mmol/L — ABNORMAL LOW (ref 3.5–5.1)
Sodium: 133 mmol/L — ABNORMAL LOW (ref 135–145)

## 2019-11-22 MED ORDER — METRONIDAZOLE 500 MG PO TABS
500.0000 mg | ORAL_TABLET | Freq: Three times a day (TID) | ORAL | 0 refills | Status: AC
Start: 2019-11-22 — End: 2019-11-26

## 2019-11-22 MED ORDER — CIPROFLOXACIN HCL 500 MG PO TABS
500.0000 mg | ORAL_TABLET | Freq: Two times a day (BID) | ORAL | 0 refills | Status: AC
Start: 2019-11-22 — End: 2019-11-26

## 2019-11-22 MED ORDER — POTASSIUM CHLORIDE ER 20 MEQ PO TBCR: 20.0000 meq | EXTENDED_RELEASE_TABLET | Freq: Every day | ORAL | 0 refills | Status: DC

## 2019-11-22 MED ORDER — ONDANSETRON HCL 4 MG PO TABS: 4.0000 mg | ORAL_TABLET | Freq: Three times a day (TID) | ORAL | 0 refills | Status: DC | PRN

## 2019-11-22 MED ORDER — POTASSIUM CHLORIDE CRYS ER 20 MEQ PO TBCR
40.0000 meq | EXTENDED_RELEASE_TABLET | Freq: Two times a day (BID) | ORAL | Status: DC
  Administered 2019-11-22: 40 meq via ORAL
  Filled 2019-11-22: qty 2

## 2019-11-22 NOTE — Discharge Summary (Signed)
Physician Discharge Summary  Andre RichJalil Endsley ZOX:[id number] DOB: May 24, 1986 DOA: 11/19/2019  PCP: Jac Canavanysinger, David S, PA-C  Admit date: 11/19/2019 Discharge date: 11/22/2019  Admitted From: Home Disposition:  Home  Discharge Condition:Stable CODE STATUS:FULL Diet recommendation:  Regular   Brief/Interim Summary: Patient is a 34 year old male with no significant past medical history who presented yesterday at [facility] with nausea,vomiting,abdominal pain. He said he had onset of symptoms after he ate at [employer].CT abdomen/pelvis showedmildsegmental thickening of the distal descending and sigmoid colon suggesting colitis. He was discharged with Flagyl and Cipro but he came back to the emergency department at [facility] with persistent nausea and vomiting, inability to tolerate oral intake.  Patient also reported daily use of marijuana.Patient being admitted for the management of colitis, intractable nausea and vomiting. He was found to have mild hypokalemia along with leukocytosis. Nausea and vomiting have improved today.  He tolerated the regular diet.  He is hemodynamically stable for discharge home today.  Following problems were addressed during his hospitalization:  Intractable nausea/vomiting: Improved today.    He was treated with IV fluids, antiemetics.  Nausea and vomiting have significantly improved today.  Tolerated regular diet today.  Colitis: Presented with abdominal pain, nausea, vomiting after eating at [employer].  CT abdomen showed mild segmental thickening of the distal descending and sigmoid colon persisting pancolitis of inflammatory or infectious etiology.  Started on ceftriaxone and Flagyl.    No diarrhea at the moment.  Continue oral antibiotics to finish the course of 7 days.  Cyclic vomiting syndrome/marijuana use: Patient reported daily marijuana use.  Patient symptoms could also be associated with cyclic vomiting syndrome associated  with  marijuana.  Counseled for cessation/limitation  Leukocytosis: Improved  Hypokalemia: Continue supplementation  Discharge Diagnoses:  Principal Problem:   Intractable nausea and vomiting Active Problems:   Colitis   Hypokalemia   Tobacco abuse    Discharge Instructions  Discharge Instructions    Diet general   Complete by: As directed    Discharge instructions   Complete by: As directed    1)Please take rescue medications as instructed. 2)Follow up with your PCP in a week. 3)Limit the use of marijuana.   Increase activity slowly   Complete by: As directed      Allergies as of 11/22/2019   No Known Allergies     Medication List    STOP taking these medications   ibuprofen 800 MG tablet Commonly known as: ADVIL   Olopatadine HCl 0.2 % Soln   Polyethyl Glycol-Propyl Glycol 0.4-0.3 % Gel ophthalmic gel Commonly known as: Systane     TAKE these medications   acetaminophen 500 MG tablet Commonly known as: TYLENOL Take 500 mg by mouth every 6 (six) hours as needed for moderate pain.   ciprofloxacin 500 MG tablet Commonly known as: Cipro Take 1 tablet (500 mg total) by mouth 2 (two) times daily for 4 days. What changed: when to take this   metroNIDAZOLE 500 MG tablet Commonly known as: Flagyl Take 1 tablet (500 mg total) by mouth 3 (three) times daily for 4 days. What changed: when to take this   ondansetron 4 MG tablet Commonly known as: Zofran Take 1 tablet (4 mg total) by mouth every 8 (eight) hours as needed for nausea or vomiting.   Potassium Chloride ER 20 MEQ Tbcr Take 20 mEq by mouth daily for 5 days.   promethazine 25 MG tablet Commonly known as: PHENERGAN Take 1 tablet (25 mg total)  by mouth every 6 (six) hours as needed for nausea or vomiting.      Follow-up Information    Tysinger, Kermit Balo, PA-C. Schedule an appointment as soon as possible for a visit in 1 week(s).   Specialty: Family Medicine Contact information: [address] [place] [address] [phone]          No Known Allergies  Consultations:  None   Procedures/Studies: CT Abdomen Pelvis W Contrast  Result Date: 11/18/2019 CLINICAL DATA:  Diverticulitis suspected, vomiting and left lower quadrant abdominal pain, denies fevers EXAM: CT ABDOMEN AND PELVIS WITH CONTRAST TECHNIQUE: Multidetector CT imaging of the abdomen and pelvis was performed using the standard protocol following bolus administration of intravenous contrast. CONTRAST:  OMNIPAQUE IOHEXOL 300 MG/ML  SOLN COMPARISON:  None FINDINGS: Lower chest: Lung bases are clear. Normal heart size. No pericardial effusion. Hepatobiliary: No focal liver abnormality is seen. No gallstones, gallbladder wall thickening, or biliary dilatation. Pancreas: Unremarkable. No pancreatic ductal dilatation or surrounding inflammatory changes. Spleen: Normal in size without focal abnormality. Small accessory splenule. Adrenals/Urinary Tract: Normal adrenal glands. Kidneys are normally located with symmetric enhancement with mild early excretion. No suspicious renal lesion, urolithiasis or hydronephrosis. Stomach/Bowel: Distal esophagus, stomach and duodenal sweep are unremarkable. No small bowel wall thickening or dilatation. No evidence of obstruction. A normal appendix is visualized. Proximal colon is unremarkable. There is some mild segmental thickening of the distal descending and sigmoid colon with faint Peri colonic haze. Few scattered colonic diverticula without focal inflammation to suggest acute diverticulitis. Vascular/Lymphatic: The aorta is normal caliber. No suspicious or enlarged lymph nodes in the included lymphatic chains. Reproductive: The prostate and seminal vesicles are unremarkable. Other: No abdominopelvic free fluid or free gas. No bowel containing hernias. Musculoskeletal: No acute osseous abnormality or suspicious osseous lesion. IMPRESSION: 1. Mild segmental thickening of the distal  descending and sigmoid colon with faint pericolonic haze may represent a mild colitis of infectious or inflammatory etiology. 2. Few scattered colonic diverticula without focal inflammation to suggest acute diverticulitis. Electronically Signed   By: Kreg Shropshire M.D.   On: 11/18/2019 06:24      Subjective: Patient seen and examined at the bedside this morning.  hemodynamically stable for discharge today.  Discharge Exam: Vitals:   11/21/19 2114 11/22/19 0517  BP: (!) 149/94 (!) 144/85  Pulse: (!) 55 67  Resp:  18  Temp:  98.2 F (36.8 C)  SpO2:  100%   Vitals:   11/21/19 1531 11/21/19 2109 11/21/19 2114 11/22/19 0517  BP: (!) 153/101 (!) 161/95 (!) 149/94 (!) 144/85  Pulse: (!) 59 66 (!) 55 67  Resp: 18 18  18   Temp: 98.4 F (36.9 C) 98.7 F (37.1 C)  98.2 F (36.8 C)  TempSrc: Oral Oral  Oral  SpO2: 99% 100%  100%  Weight:      Height:        General: Pt is alert, awake, not in acute distress Cardiovascular: RRR, S1/S2 +, no rubs, no gallops Respiratory: CTA bilaterally, no wheezing, no rhonchi Abdominal: Soft, NT, ND, bowel sounds + Extremities: no edema, no cyanosis    The results of significant diagnostics from this hospitalization (including imaging, microbiology, ancillary and laboratory) are listed below for reference.     Microbiology: Recent Results (from the past 240 hour(s))  Urine culture     Status: None   Collection Time: 11/18/19  5:03 AM   Specimen: Urine, Random  Result Value Ref Range Status  Specimen Description   Final    URINE, RANDOM Performed at [facility], [place] [address] [place], [place] [address]    Special Requests   Final    NONE Performed at [facility], [address], [place], [place] [address]    Culture   Final    NO GROWTH Performed at [facility], [place] [address] [place], [place] [address]    Report Status 11/19/2019 FINAL  Final  Respiratory Panel by RT PCR (Flu A&B,  Covid) - Nasopharyngeal Swab     Status: None   Collection Time: 11/19/19  6:07 PM   Specimen: Nasopharyngeal Swab  Result Value Ref Range Status   SARS Coronavirus 2 by RT PCR NEGATIVE NEGATIVE Final    Comment: (NOTE) SARS-CoV-2 target nucleic acids are NOT DETECTED. The SARS-CoV-2 RNA is generally detectable in upper respiratoy specimens during the acute phase of infection. The lowest concentration of SARS-CoV-2 viral copies this assay can detect is 131 copies/mL. A negative result does not preclude SARS-Cov-2 infection and should not be used as the sole basis for treatment or other patient management decisions. A negative result may occur with  improper specimen collection/handling, submission of specimen other than nasopharyngeal swab, presence of viral mutation(s) within the areas targeted by this assay, and inadequate number of viral copies (<131 copies/mL). A negative result must be combined with clinical observations, patient history, and epidemiological information. The expected result is Negative. Fact Sheet for Patients:  PinkCheek.be Fact Sheet for Healthcare Providers:  GravelBags.it This test is not yet ap proved or cleared by the [place] FDA and  has been authorized for detection and/or diagnosis of SARS-CoV-2 by FDA under an Emergency Use Authorization (EUA). This EUA will remain  in effect (meaning this test can be used) for the duration of the COVID-19 declaration under Section 564(b)(1) of the Act, 21 U.S.C. section 360bbb-3(b)(1), unless the authorization is terminated or revoked sooner.    Influenza A by PCR NEGATIVE NEGATIVE Final   Influenza B by PCR NEGATIVE NEGATIVE Final    Comment: (NOTE) The Xpert Xpress SARS-CoV-2/FLU/RSV assay is intended as an aid in  the diagnosis of influenza from Nasopharyngeal swab specimens and  should not be used as a sole basis for treatment. Nasal washings and   aspirates are unacceptable for Xpert Xpress SARS-CoV-2/FLU/RSV  testing. Fact Sheet for Patients: PinkCheek.be Fact Sheet for Healthcare Providers: GravelBags.it This test is not yet approved or cleared by the [place] FDA and  has been authorized for detection and/or diagnosis of SARS-CoV-2 by  FDA under an Emergency Use Authorization (EUA). This EUA will remain  in effect (meaning this test can be used) for the duration of the  Covid-19 declaration under Section 564(b)(1) of the Act, 21  U.S.C. section 360bbb-3(b)(1), unless the authorization is  terminated or revoked. Performed at [facility], [address] [place], [place] [address]      Labs: BNP (last 3 results) No results for input(s): BNP in the last 8760 hours. Basic Metabolic Panel: Recent Labs  Lab 11/18/19 0152 11/19/19 1135 11/19/19 2241 11/20/19 0555 11/21/19 0534 11/22/19 0647  NA 139 133*  --  136 134* 133*  K 3.4* 3.2*  --  3.6 3.5 3.2*  CL 105 100  --  102 102 100  CO2 25 25  --  21* 21* 21*  GLUCOSE 107* 100*  --  88 83 81  BUN 18 17  --  19 13 11   CREATININE 1.01 0.93  --  1.01 0.83 0.80  CALCIUM 9.3 8.7*  --  8.6* 8.3* 8.7*  MG  --   --  1.8  --   --   --    Liver Function Tests: Recent Labs  Lab 11/18/19 0152 11/19/19 1135  AST 27 20  ALT 29 24  ALKPHOS 54 52  BILITOT 0.5 0.6  PROT 8.4* 7.4  ALBUMIN 4.6 3.8   Recent Labs  Lab 11/18/19 0152 11/19/19 1135  LIPASE 50 25   No results for input(s): AMMONIA in the last 168 hours. CBC: Recent Labs  Lab 11/18/19 0152 11/19/19 1135 11/20/19 0555 11/21/19 0534  WBC 8.6 14.5* 12.5* 12.7*  NEUTROABS  --  10.7*  --  9.0*  HGB 15.3 14.5 14.5 14.7  HCT 45.0 42.8 43.8 43.0  MCV 88.9 86.5 90.3 87.9  PLT 261 303 287 261   Cardiac Enzymes: No results for input(s): CKTOTAL, CKMB, CKMBINDEX, TROPONINI in the last 168 hours. BNP: Invalid input(s):  POCBNP CBG: Recent Labs  Lab 11/18/19 0341  GLUCAP 97   D-Dimer No results for input(s): DDIMER in the last 72 hours. Hgb A1c No results for input(s): HGBA1C in the last 72 hours. Lipid Profile No results for input(s): CHOL, HDL, LDLCALC, TRIG, CHOLHDL, LDLDIRECT in the last 72 hours. Thyroid function studies No results for input(s): TSH, T4TOTAL, T3FREE, THYROIDAB in the last 72 hours.  Invalid input(s): FREET3 Anemia work up No results for input(s): VITAMINB12, FOLATE, FERRITIN, TIBC, IRON, RETICCTPCT in the last 72 hours. Urinalysis    Component Value Date/Time   COLORURINE AMBER (A) 11/19/2019 1125   APPEARANCEUR CLEAR 11/19/2019 1125   LABSPEC 1.015 11/19/2019 1125   LABSPEC 1.020 03/29/2017 1435   PHURINE 7.5 11/19/2019 1125   GLUCOSEU NEGATIVE 11/19/2019 1125   HGBUR NEGATIVE 11/19/2019 1125   BILIRUBINUR SMALL (A) 11/19/2019 1125   BILIRUBINUR negative 03/29/2017 1435   KETONESUR >80 (A) 11/19/2019 1125   PROTEINUR 30 (A) 11/19/2019 1125   NITRITE NEGATIVE 11/19/2019 1125   LEUKOCYTESUR NEGATIVE 11/19/2019 1125   Sepsis Labs Invalid input(s): PROCALCITONIN,  WBC,  LACTICIDVEN Microbiology Recent Results (from the past 240 hour(s))  Urine culture     Status: None   Collection Time: 11/18/19  5:03 AM   Specimen: Urine, Random  Result Value Ref Range Status   Specimen Description   Final    URINE, RANDOM Performed at [facility], [address], [place], [place]    Special Requests   Final    NONE Performed at [facility] [address] [place], [place]    Culture   Final    NO GROWTH Performed at [facility], [address] [place], [place]    Report Status 11/19/2019 FINAL  Final  Respiratory Panel by RT PCR (Flu A&B, Covid) - Nasopharyngeal Swab     Status: None   Collection Time: 11/19/19  6:07 PM   Specimen: Nasopharyngeal Swab  Result Value Ref Range Status   SARS  Coronavirus 2 by RT PCR NEGATIVE NEGATIVE Final    Comment: (NOTE) SARS-CoV-2 target nucleic acids are NOT DETECTED. The SARS-CoV-2 RNA is generally detectable in upper respiratoy specimens during the acute phase of infection. The lowest concentration of SARS-CoV-2 viral copies this assay can detect is 131 copies/mL. A negative result does not preclude SARS-Cov-2 infection and should not be used as the sole basis for treatment or  other patient management decisions. A negative result may occur with  improper specimen collection/handling, submission of specimen other than nasopharyngeal swab, presence of viral mutation(s) within the areas targeted by this assay, and inadequate number of viral copies (<131 copies/mL). A negative result must be combined with clinical observations, patient history, and epidemiological information. The expected result is Negative. Fact Sheet for Patients:  https://www.moore.com/ Fact Sheet for Healthcare Providers:  https://www.young.biz/ This test is not yet ap proved or cleared by the [place] FDA and  has been authorized for detection and/or diagnosis of SARS-CoV-2 by FDA under an Emergency Use Authorization (EUA). This EUA will remain  in effect (meaning this test can be used) for the duration of the COVID-19 declaration under Section 564(b)(1) of the Act, 21 U.S.C. section 360bbb-3(b)(1), unless the authorization is terminated or revoked sooner.    Influenza A by PCR NEGATIVE NEGATIVE Final   Influenza B by PCR NEGATIVE NEGATIVE Final    Comment: (NOTE) The Xpert Xpress SARS-CoV-2/FLU/RSV assay is intended as an aid in  the diagnosis of influenza from Nasopharyngeal swab specimens and  should not be used as a sole basis for treatment. Nasal washings and  aspirates are unacceptable for Xpert Xpress SARS-CoV-2/FLU/RSV  testing. Fact Sheet for Patients: https://www.moore.com/ Fact Sheet  for Healthcare Providers: https://www.young.biz/ This test is not yet approved or cleared by the [place] FDA and  has been authorized for detection and/or diagnosis of SARS-CoV-2 by  FDA under an Emergency Use Authorization (EUA). This EUA will remain  in effect (meaning this test can be used) for the duration of the  Covid-19 declaration under Section 564(b)(1) of the Act, 21  U.S.C. section 360bbb-3(b)(1), unless the authorization is  terminated or revoked. Performed at [facility], [address] [place], [place] [address]     Please note: You were cared for by a hospitalist during your hospital stay. Once you are discharged, your primary care physician will handle any further medical issues. Please note that NO REFILLS for any discharge medications will be authorized once you are discharged, as it is imperative that you return to your primary care physician (or establish a relationship with a primary care physician if you do not have one) for your post hospital discharge needs so that they can reassess your need for medications and monitor your lab values.    Time coordinating discharge: 40 minutes  SIGNED:   Burnadette Pop, MD  [facility] 11/22/2019, 2:38 PM Pager [phone]  If 7PM-7AM, please contact night-coverage [email or web address] Password [email or web address]

## 2019-11-22 NOTE — Progress Notes (Signed)
Pt being discharged this afternoon with his "friend". Alert, oriented, and without c/o. Discharge instructions given/explained with pt verbalizing understanding.  Pt aware to followup with PCP.

## 2019-11-24 ENCOUNTER — Telehealth: Payer: Self-pay

## 2019-11-24 NOTE — Telephone Encounter (Signed)
I called pt. LM for him to call back to get scheduled for a hospital f/u and to go over his medication changes.

## 2019-11-24 NOTE — Telephone Encounter (Signed)
Yes try and get him in

## 2019-11-24 NOTE — Telephone Encounter (Signed)
This pt. Showed up on my TOC report was in the hospital for nausea and vomiting and is to f/u with PCP in 1 week. I noticed he has not been seen here since 03/29/17 and had a no show on his f/u. Did you still want me call him to schedule a f/u.

## 2019-11-29 ENCOUNTER — Other Ambulatory Visit: Payer: Self-pay

## 2019-11-29 ENCOUNTER — Emergency Department (HOSPITAL_BASED_OUTPATIENT_CLINIC_OR_DEPARTMENT_OTHER)
Admission: EM | Admit: 2019-11-29 | Discharge: 2019-11-29 | Disposition: A | Discharge status: Home / Self Care | Payer: Self-pay | Attending: Emergency Medicine | Admitting: Emergency Medicine

## 2019-11-29 ENCOUNTER — Encounter (HOSPITAL_BASED_OUTPATIENT_CLINIC_OR_DEPARTMENT_OTHER): Payer: Self-pay | Admitting: Emergency Medicine

## 2019-11-29 ENCOUNTER — Emergency Department (HOSPITAL_BASED_OUTPATIENT_CLINIC_OR_DEPARTMENT_OTHER): Payer: Self-pay

## 2019-11-29 DIAGNOSIS — F17210 Nicotine dependence, cigarettes, uncomplicated: Secondary | ICD-10-CM | POA: Insufficient documentation

## 2019-11-29 DIAGNOSIS — R112 Nausea with vomiting, unspecified: Secondary | ICD-10-CM | POA: Insufficient documentation

## 2019-11-29 DIAGNOSIS — Z79899 Other long term (current) drug therapy: Secondary | ICD-10-CM | POA: Insufficient documentation

## 2019-11-29 DIAGNOSIS — R1084 Generalized abdominal pain: Secondary | ICD-10-CM | POA: Insufficient documentation

## 2019-11-29 LAB — URINALYSIS, MICROSCOPIC (REFLEX): RBC / HPF: NONE SEEN RBC/hpf (ref 0–5)

## 2019-11-29 LAB — URINALYSIS, ROUTINE W REFLEX MICROSCOPIC
Bilirubin Urine: NEGATIVE
Glucose, UA: NEGATIVE mg/dL
Hgb urine dipstick: NEGATIVE
Ketones, ur: >80 mg/dL — AB
Leukocytes,Ua: NEGATIVE
Nitrite: NEGATIVE
Protein, ur: 30 mg/dL — AB
Specific Gravity, Urine: 1.02 (ref 1.005–1.030)
pH: 6.5 (ref 5.0–8.0)

## 2019-11-29 LAB — CBC WITH DIFFERENTIAL/PLATELET
Abs Immature Granulocytes: 0.05 10*3/uL (ref 0.00–0.07)
Basophils Absolute: 0 10*3/uL (ref 0.0–0.1)
Basophils Relative: 0 %
Eosinophils Absolute: 0.1 10*3/uL (ref 0.0–0.5)
Eosinophils Relative: 1 %
HCT: 46.4 % (ref 39.0–52.0)
Hemoglobin: 16.8 g/dL (ref 13.0–17.0)
Immature Granulocytes: 1 %
Lymphocytes Relative: 22 %
Lymphs Abs: 2.2 10*3/uL (ref 0.7–4.0)
MCH: 30.8 pg (ref 26.0–34.0)
MCHC: 36.2 g/dL — ABNORMAL HIGH (ref 30.0–36.0)
MCV: 85 fL (ref 80.0–100.0)
Monocytes Absolute: 1 10*3/uL (ref 0.1–1.0)
Monocytes Relative: 10 %
Neutro Abs: 6.8 10*3/uL (ref 1.7–7.7)
Neutrophils Relative %: 66 %
Platelets: 361 10*3/uL (ref 150–400)
RBC: 5.46 MIL/uL (ref 4.22–5.81)
RDW: 11.9 % (ref 11.5–15.5)
WBC: 10.2 10*3/uL (ref 4.0–10.5)
nRBC: 0 % (ref 0.0–0.2)

## 2019-11-29 LAB — COMPREHENSIVE METABOLIC PANEL
ALT: 47 U/L — ABNORMAL HIGH (ref 0–44)
AST: 23 U/L (ref 15–41)
Albumin: 4.3 g/dL (ref 3.5–5.0)
Alkaline Phosphatase: 54 U/L (ref 38–126)
Anion gap: 13 (ref 5–15)
BUN: 18 mg/dL (ref 6–20)
CO2: 26 mmol/L (ref 22–32)
Calcium: 9.1 mg/dL (ref 8.9–10.3)
Chloride: 95 mmol/L — ABNORMAL LOW (ref 98–111)
Creatinine, Ser: 1.04 mg/dL (ref 0.61–1.24)
GFR calc Af Amer: >60 mL/min (ref >60)
GFR calc non Af Amer: >60 mL/min (ref >60)
Glucose, Bld: 116 mg/dL — ABNORMAL HIGH (ref 70–99)
Potassium: 3.3 mmol/L — ABNORMAL LOW (ref 3.5–5.1)
Sodium: 134 mmol/L — ABNORMAL LOW (ref 135–145)
Total Bilirubin: 0.6 mg/dL (ref 0.3–1.2)
Total Protein: 7.9 g/dL (ref 6.5–8.1)

## 2019-11-29 LAB — RAPID URINE DRUG SCREEN, HOSP PERFORMED
Amphetamines: NOT DETECTED
Barbiturates: NOT DETECTED
Benzodiazepines: NOT DETECTED
Cocaine: NOT DETECTED
Opiates: NOT DETECTED
Tetrahydrocannabinol: POSITIVE — AB

## 2019-11-29 LAB — LIPASE, BLOOD: Lipase: 32 U/L (ref 11–51)

## 2019-11-29 MED ORDER — SODIUM CHLORIDE 0.9 % IV BOLUS
1000.0000 mL | Freq: Once | INTRAVENOUS | Status: AC
  Administered 2019-11-29: 1000 mL via INTRAVENOUS

## 2019-11-29 MED ORDER — PROMETHAZINE HCL 25 MG/ML IJ SOLN
12.5000 mg | Freq: Once | INTRAMUSCULAR | Status: AC
  Administered 2019-11-29: 12.5 mg via INTRAVENOUS
  Filled 2019-11-29: qty 1

## 2019-11-29 MED ORDER — PROMETHAZINE HCL 25 MG PO TABS
25.0000 mg | ORAL_TABLET | Freq: Three times a day (TID) | ORAL | 0 refills | Status: AC | PRN
Start: 2019-11-29 — End: ?

## 2019-11-29 MED ORDER — IOHEXOL 300 MG/ML  SOLN
100.0000 mL | Freq: Once | INTRAMUSCULAR | Status: AC | PRN
  Administered 2019-11-29: 100 mL via INTRAVENOUS

## 2019-11-29 MED ORDER — HALOPERIDOL LACTATE 5 MG/ML IJ SOLN
2.0000 mg | Freq: Once | INTRAMUSCULAR | Status: AC
  Administered 2019-11-29: 2 mg via INTRAVENOUS
  Filled 2019-11-29: qty 1

## 2019-11-29 NOTE — ED Notes (Signed)
Pt was given ice water 

## 2019-11-29 NOTE — ED Provider Notes (Signed)
[facility] EMERGENCY DEPARTMENT Provider Note   CSN: [id number] Arrival date & time: 11/29/19  1351     History Chief Complaint  Patient presents with  . Emesis    Andre Gibbs is a 34 y.o. male past medical history of colitis who presents for evaluation of nausea/vomiting and abdominal pain.  He was seen about 2 weeks ago for evaluation of symptoms.  Initially he was seen on 11/18/2019.  He was discharged home with treatment for infectious colitis.  He returned back on 11/19/2019 and was admitted for intractable nausea and vomiting.  He had been started on Cipro and Flagyl.  He was discharged on 11/22/2019.  He reports that initially, he felt better and was having improvement in his symptoms.  He reports that about 5 days ago, he ate some spaghetti and felt that symptoms returned.  He said since then he has had intermittent nausea/vomiting.  This morning he ate some pineapple and felt like the vomiting got worse.  No blood noted in emesis.  He states he feels weak all over and feels very dehydrated which is what prompted his ED visit today.  He has not had any diarrhea.  He states that he stopped the antibiotics with 1 day left because he was having difficulty tolerating medication.  He states that his abdomen started hurting after he vomited.  He states it is mostly in the lower part of his abdomen.  He has not noted any fevers.  He has been able to urinate without any difficulty.  He denies any chest pain, difficulty breathing, dysuria, hematuria.  The history is provided by the patient.       Past Medical History:  Diagnosis Date  . Allergy     Patient Active Problem List   Diagnosis Date Noted  . Intractable nausea and vomiting 11/19/2019  . Colitis 11/19/2019  . Hypokalemia 11/19/2019  . Tobacco abuse 11/19/2019  . Penile discharge 03/29/2017  . Foreskin problem 03/29/2017  . Screen for STD (sexually transmitted disease) 03/29/2017  . Anxiety 03/29/2017  . Need for  influenza vaccination 03/29/2017  . Chronic diarrhea 03/29/2017  . Encounter for health maintenance examination in adult 03/29/2017    History reviewed. No pertinent surgical history.     Family History  Problem Relation Age of Onset  . Stroke Mother        x3  . Hypertension Mother   . Other Father        history unknown  . Hypertension Maternal Grandmother   . Heart disease Maternal Grandmother   . Diabetes Maternal Grandmother   . Cancer Neg Hx     Social History   Tobacco Use  . Smoking status: Current Some Day Smoker    Packs/day: 10.00    Last attempt to quit: 03/01/2013    Years since quitting: 6.7  . Smokeless tobacco: Never Used  Substance Use Topics  . Alcohol use: Yes    Alcohol/week: 2.0 standard drinks    Types: 1 Cans of beer, 1 Shots of liquor per week  . Drug use: No    Home Medications Prior to Admission medications   Medication Sig Start Date End Date Taking? Authorizing Provider  acetaminophen (TYLENOL) 500 MG tablet Take 500 mg by mouth every 6 (six) hours as needed for moderate pain.    [provider]  ondansetron (ZOFRAN) 4 MG tablet Take 1 tablet (4 mg total) by mouth every 8 (eight) hours as needed for nausea or vomiting. 11/22/19  11/21/20  Burnadette PopAdhikari, Amrit, MD  potassium chloride 20 MEQ TBCR Take 20 mEq by mouth daily for 5 days. 11/22/19 11/27/19  Burnadette PopAdhikari, Amrit, MD  promethazine (PHENERGAN) 25 MG tablet Take 1 tablet (25 mg total) by mouth every 8 (eight) hours as needed for nausea or vomiting. 11/29/19   Maxwell CaulLayden, Miriam Kestler A, PA-C    Allergies    Patient has no known allergies.  Review of Systems   Review of Systems  Constitutional: Negative for fever.  Respiratory: Negative for cough and shortness of breath.   Cardiovascular: Negative for chest pain.  Gastrointestinal: Positive for nausea and vomiting. Negative for abdominal pain.  Genitourinary: Negative for dysuria and hematuria.  Neurological: Positive for weakness (generalized).  Negative for numbness and headaches.  All other systems reviewed and are negative.   Physical Exam Updated Vital Signs BP 122/72 (BP Location: Right Arm)   Pulse 97   Temp 98.2 F (36.8 C) (Oral)   Resp 18   Ht 6\' 4"  (1.93 m)   Wt 121 kg   SpO2 100%   BMI 32.47 kg/m   Physical Exam Vitals and nursing note reviewed.  Constitutional:      Appearance: Normal appearance. He is well-developed.  HENT:     Head: Normocephalic and atraumatic.  Eyes:     General: Lids are normal.     Conjunctiva/sclera: Conjunctivae normal.     Pupils: Pupils are equal, round, and reactive to light.  Cardiovascular:     Rate and Rhythm: Normal rate and regular rhythm.     Pulses: Normal pulses.     Heart sounds: Normal heart sounds. No murmur. No friction rub. No gallop.   Pulmonary:     Effort: Pulmonary effort is normal.     Breath sounds: Normal breath sounds.     Comments: Lungs clear to auscultation bilaterally.  Symmetric chest rise.  No wheezing, rales, rhonchi. Abdominal:     Palpations: Abdomen is soft. Abdomen is not rigid.     Tenderness: There is abdominal tenderness in the right lower quadrant, suprapubic area and left lower quadrant. There is no right CVA tenderness, left CVA tenderness or guarding.     Comments: Abdomen is soft, nondistended.  No CVA tenderness noted bilaterally.  Tenderness palpation of the lower abdomen diffusely.  No rigidity, guarding.  Musculoskeletal:        General: Normal range of motion.     Cervical back: Full passive range of motion without pain.  Skin:    General: Skin is warm and dry.     Capillary Refill: Capillary refill takes less than 2 seconds.  Neurological:     Mental Status: He is alert and oriented to person, place, and time.     Comments: Cranial nerves III-XII intact Follows commands, Moves all extremities  5/5 strength to BUE and BLE  Sensation intact throughout all major nerve distributions No gait abnormalities  No slurred speech.  No facial droop.   Psychiatric:        Speech: Speech normal.     ED Results / Procedures / Treatments   Labs (all labs ordered are listed, but only abnormal results are displayed) Labs Reviewed  RAPID URINE DRUG SCREEN, HOSP PERFORMED - Abnormal; Notable for the following components:      Result Value   Tetrahydrocannabinol POSITIVE (*)    All other components within normal limits  URINALYSIS, ROUTINE W REFLEX MICROSCOPIC - Abnormal; Notable for the following components:   Ketones, ur >80 (*)  Protein, ur 30 (*)    All other components within normal limits  COMPREHENSIVE METABOLIC PANEL - Abnormal; Notable for the following components:   Sodium 134 (*)    Potassium 3.3 (*)    Chloride 95 (*)    Glucose, Bld 116 (*)    ALT 47 (*)    All other components within normal limits  CBC WITH DIFFERENTIAL/PLATELET - Abnormal; Notable for the following components:   MCHC 36.2 (*)    All other components within normal limits  URINALYSIS, MICROSCOPIC (REFLEX) - Abnormal; Notable for the following components:   Bacteria, UA MANY (*)    All other components within normal limits  LIPASE, BLOOD    EKG None  Radiology CT ABDOMEN PELVIS W CONTRAST  Result Date: 11/29/2019 CLINICAL DATA:  Recent history of colitis, abdominal pain vomiting 4 days ago EXAM: CT ABDOMEN AND PELVIS WITH CONTRAST TECHNIQUE: Multidetector CT imaging of the abdomen and pelvis was performed using the standard protocol following bolus administration of intravenous contrast. CONTRAST:  OMNIPAQUE IOHEXOL 300 MG/ML  SOLN COMPARISON:  None. FINDINGS: Lower chest: The visualized heart size within normal limits. No pericardial fluid/thickening. No hiatal hernia. The visualized portions of the lungs are clear. Hepatobiliary: The liver is normal in density without focal abnormality.The main portal vein is patent. No evidence of calcified gallstones, gallbladder wall thickening or biliary dilatation. Pancreas:  Unremarkable. No pancreatic ductal dilatation or surrounding inflammatory changes. Spleen: Normal in size without focal abnormality. Adrenals/Urinary Tract: Both adrenal glands appear normal. The kidneys and collecting system appear normal without evidence of urinary tract calculus or hydronephrosis. Bladder is unremarkable. Stomach/Bowel: The stomach, small bowel, and colon are normal in appearance. No inflammatory changes, wall thickening, or obstructive findings.The appendix is normal. Vascular/Lymphatic: There are no enlarged mesenteric, retroperitoneal, or pelvic lymph nodes. No significant vascular findings are present. Reproductive: The prostate is unremarkable. Other: Tiny fat containing anterior umbilical hernia noted. Musculoskeletal: No acute or significant osseous findings. IMPRESSION: No acute intra-abdominal or pelvic pathology to explain the patient's symptoms. Electronically Signed   By: Jonna Clark M.D.   On: 11/29/2019 19:11    Procedures Procedures (including critical care time)  Medications Ordered in ED Medications  sodium chloride 0.9 % bolus 1,000 mL (0 mLs Intravenous Stopped 11/29/19 1605)  promethazine (PHENERGAN) injection 12.5 mg (12.5 mg Intravenous Given 11/29/19 1506)  sodium chloride 0.9 % bolus 1,000 mL ( Intravenous Stopped 11/29/19 1845)  haloperidol lactate (HALDOL) injection 2 mg (2 mg Intravenous Given 11/29/19 1722)  iohexol (OMNIPAQUE) 300 MG/ML solution 100 mL (100 mLs Intravenous Contrast Given 11/29/19 1849)    ED Course  I have reviewed the triage vital signs and the nursing notes.  Pertinent labs & imaging results that were available during my care of the patient were reviewed by me and considered in my medical decision making (see chart for details).    MDM Rules/Calculators/A&P                      34 year old male who presents for evaluation of nausea/vomiting and abdominal pain.  Recently admitted to [facility] for intractable nausea/vomiting.   Treated for infectious colitis with Cipro and Flagyl.  Had improvement in symptoms up until a few days ago.  No fevers.  On initial ED arrival, he is afebrile, nontoxic-appearing.  He is slightly tachycardic but vitals otherwise stable.  On exam, he does have some tenderness noted diffusely to the lower abdomen.  Plan to recheck labs,  urine.  Plan for fluids, antiemetics.  Consider hyperemesis cannabinoid syndrome versus infectious etiology.  History/physical exam concerning for CVA.  No evidence of neuro deficits noted on exam.  No gait abnormalities.  Do not suspect this is posterior circulation stroke.  UDS positive for marijuana.  Lipase is normal.  CMP shows potassium of 3.3.  BUN and creatinine within normal limits.  He is ALT is 47.  Anion gap is 13.  UA does show small amount of ketones.  No evidence of infectious etiology.  CBC shows no leukocytosis which is improved from his previous.  Hemoglobin is normal.  I discussed results with patient.  He reports feeling slightly better after initial liter of fluid.  He is not any more vomiting here in the ED.  I discussed with him at length regarding his work-up here in the emergency department.  We engaged in shared decision-making regarding further treatment options here in the emergency department.  At this time, he has no focal abdominal tenderness that would be concerning for appendicitis or diverticulitis.  He has more diffuse abdominal tenderness and feels like it is mostly soreness from vomiting.  I discussed with him regarding obtaining a CT on pelvis.  We discussed risk first benefits, including increased radiation exposure versus missed diagnosis.  After extensive discussion with both patient and engaging in shared decision-making, patient opted not to do a CT on pelvis at this time.  Given history/physical exam and reassuring work-up, I feel that this is reasonable.  Will give additional liter of fluid and plan to p.o. challenge patient.  Patient  had additional episode of vomiting after p.o. challenge.  Reevaluation.  I discussed with patient regarding vomiting.  Patient now requesting CT on pelvis for further evaluation of his symptoms.  Feel this is reasonable given continued vomiting to ensure there is no infectious process that would be contributing.  No hepatobiliary etiology.   Reevaluation.  Patient reports improvement.  He has been sleeping comfortably without any signs of distress.  Repeat p.o. challenge was successful and he was able to tolerate p.o. without any vomiting.  He reports that his abdomen feels better.  Repeat abdominal exam is benign without any signs of tenderness.  He is hemodynamically stable.  Patient states he feels comfortable going home.  He is ambulatory in the emergency department not any signs of distress.  At this time, unclear etiology of his symptoms.  This could be related to marijuana use though he says he has not used it since going into the hospital.  Additionally, this could be triggered by certain food sensitivities.  At this time, feel it is beneficial for him to follow-up with GI for further evaluation of his symptoms. At this time, patient exhibits no emergent life-threatening condition that require further evaluation in ED or admission. Patient had ample opportunity for questions and discussion. All patient's questions were answered with full understanding. Strict return precautions discussed. Patient expresses understanding and agreement to plan.    Portions of this note were generated with Scientist, clinical (histocompatibility and immunogenetics). Dictation errors may occur despite best attempts at proofreading.   Final Clinical Impression(s) / ED Diagnoses Final diagnoses:  Non-intractable vomiting with nausea, unspecified vomiting type  Generalized abdominal pain    Rx / DC Orders ED Discharge Orders         Ordered    promethazine (PHENERGAN) 25 MG tablet  Every 8 hours PRN     11/29/19 2022  Volanda Napoleon, PA-C 11/29/19 2305    Quintella Reichert, MD 11/30/19 [phone]

## 2019-11-29 NOTE — ED Notes (Signed)
Per EDP order, pt given fluids and/or food for PO challenge. Pt verbalized understanding to utilize call bell if nausea or emesis occur. 

## 2019-11-29 NOTE — ED Notes (Signed)
ED Provider Lillia Abed, PA at bedside.

## 2019-11-29 NOTE — Discharge Instructions (Signed)
As we discussed, your work-up today was reassuring.  Your CT scan did not show any abnormalities that would be contributing to your symptoms.  As we discussed, keep a log of what food irritate your stomach and cause you to have nausea/vomiting.  I provided referral to GI for further evaluation if you continue to have symptoms.  Return the emergency department for high fever, persistent vomiting, abdominal pain, difficulty breathing or any other worsening or concerning symptoms.

## 2019-11-29 NOTE — ED Triage Notes (Signed)
Patient has had intermittent vomiting for the last 2 weeks. The patient states that he feels weak all over.

## 2019-12-01 ENCOUNTER — Encounter (HOSPITAL_BASED_OUTPATIENT_CLINIC_OR_DEPARTMENT_OTHER): Payer: Self-pay | Admitting: *Deleted

## 2019-12-01 ENCOUNTER — Other Ambulatory Visit: Payer: Self-pay

## 2019-12-01 ENCOUNTER — Emergency Department (HOSPITAL_BASED_OUTPATIENT_CLINIC_OR_DEPARTMENT_OTHER)
Admission: EM | Admit: 2019-12-01 | Discharge: 2019-12-01 | Disposition: A | Discharge status: Home / Self Care | Payer: Self-pay | Attending: Emergency Medicine | Admitting: Emergency Medicine

## 2019-12-01 DIAGNOSIS — R1084 Generalized abdominal pain: Secondary | ICD-10-CM | POA: Insufficient documentation

## 2019-12-01 DIAGNOSIS — R112 Nausea with vomiting, unspecified: Secondary | ICD-10-CM | POA: Insufficient documentation

## 2019-12-01 DIAGNOSIS — F17210 Nicotine dependence, cigarettes, uncomplicated: Secondary | ICD-10-CM | POA: Insufficient documentation

## 2019-12-01 DIAGNOSIS — E860 Dehydration: Secondary | ICD-10-CM

## 2019-12-01 DIAGNOSIS — K5190 Ulcerative colitis, unspecified, without complications: Secondary | ICD-10-CM | POA: Insufficient documentation

## 2019-12-01 LAB — URINALYSIS, ROUTINE W REFLEX MICROSCOPIC
Bilirubin Urine: NEGATIVE
Glucose, UA: NEGATIVE mg/dL
Hgb urine dipstick: NEGATIVE
Ketones, ur: 15 mg/dL — AB
Leukocytes,Ua: NEGATIVE
Nitrite: NEGATIVE
Protein, ur: NEGATIVE mg/dL
Specific Gravity, Urine: 1.015 (ref 1.005–1.030)
pH: 7.5 (ref 5.0–8.0)

## 2019-12-01 LAB — COMPREHENSIVE METABOLIC PANEL
ALT: 41 U/L (ref 0–44)
AST: 21 U/L (ref 15–41)
Albumin: 4 g/dL (ref 3.5–5.0)
Alkaline Phosphatase: 46 U/L (ref 38–126)
Anion gap: 12 (ref 5–15)
BUN: 10 mg/dL (ref 6–20)
CO2: 26 mmol/L (ref 22–32)
Calcium: 8.9 mg/dL (ref 8.9–10.3)
Chloride: 97 mmol/L — ABNORMAL LOW (ref 98–111)
Creatinine, Ser: 0.97 mg/dL (ref 0.61–1.24)
GFR calc Af Amer: >60 mL/min (ref >60)
GFR calc non Af Amer: >60 mL/min (ref >60)
Glucose, Bld: 104 mg/dL — ABNORMAL HIGH (ref 70–99)
Potassium: 3.4 mmol/L — ABNORMAL LOW (ref 3.5–5.1)
Sodium: 135 mmol/L (ref 135–145)
Total Bilirubin: 0.7 mg/dL (ref 0.3–1.2)
Total Protein: 7.2 g/dL (ref 6.5–8.1)

## 2019-12-01 LAB — CBC WITH DIFFERENTIAL/PLATELET
Abs Immature Granulocytes: 0.03 10*3/uL (ref 0.00–0.07)
Basophils Absolute: 0 10*3/uL (ref 0.0–0.1)
Basophils Relative: 1 %
Eosinophils Absolute: 0.1 10*3/uL (ref 0.0–0.5)
Eosinophils Relative: 1 %
HCT: 40.6 % (ref 39.0–52.0)
Hemoglobin: 14.3 g/dL (ref 13.0–17.0)
Immature Granulocytes: 1 %
Lymphocytes Relative: 31 %
Lymphs Abs: 2 10*3/uL (ref 0.7–4.0)
MCH: 30.1 pg (ref 26.0–34.0)
MCHC: 35.2 g/dL (ref 30.0–36.0)
MCV: 85.5 fL (ref 80.0–100.0)
Monocytes Absolute: 0.7 10*3/uL (ref 0.1–1.0)
Monocytes Relative: 12 %
Neutro Abs: 3.6 10*3/uL (ref 1.7–7.7)
Neutrophils Relative %: 54 %
Platelets: 317 10*3/uL (ref 150–400)
RBC: 4.75 MIL/uL (ref 4.22–5.81)
RDW: 11.5 % (ref 11.5–15.5)
WBC: 6.5 10*3/uL (ref 4.0–10.5)
nRBC: 0 % (ref 0.0–0.2)

## 2019-12-01 LAB — RAPID URINE DRUG SCREEN, HOSP PERFORMED
Amphetamines: NOT DETECTED
Barbiturates: NOT DETECTED
Benzodiazepines: NOT DETECTED
Cocaine: NOT DETECTED
Opiates: NOT DETECTED
Tetrahydrocannabinol: POSITIVE — AB

## 2019-12-01 LAB — LIPASE, BLOOD: Lipase: 26 U/L (ref 11–51)

## 2019-12-01 MED ORDER — SODIUM CHLORIDE 0.9 % IV BOLUS
1000.0000 mL | Freq: Once | INTRAVENOUS | Status: AC
  Administered 2019-12-01: 1000 mL via INTRAVENOUS

## 2019-12-01 MED ORDER — METOCLOPRAMIDE HCL 5 MG/ML IJ SOLN
10.0000 mg | Freq: Once | INTRAMUSCULAR | Status: AC
  Administered 2019-12-01: 10 mg via INTRAVENOUS
  Filled 2019-12-01: qty 2

## 2019-12-01 MED ORDER — ONDANSETRON HCL 4 MG/2ML IJ SOLN
4.0000 mg | Freq: Once | INTRAMUSCULAR | Status: AC
  Administered 2019-12-01: 4 mg via INTRAVENOUS
  Filled 2019-12-01: qty 2

## 2019-12-01 MED ORDER — ONDANSETRON HCL 4 MG PO TABS: 4.0000 mg | ORAL_TABLET | Freq: Three times a day (TID) | ORAL | 0 refills | Status: DC | PRN

## 2019-12-01 MED ORDER — KETOROLAC TROMETHAMINE 30 MG/ML IJ SOLN
30.0000 mg | Freq: Once | INTRAMUSCULAR | Status: AC
  Administered 2019-12-01: 30 mg via INTRAVENOUS
  Filled 2019-12-01: qty 1

## 2019-12-01 NOTE — ED Provider Notes (Signed)
[facility] EMERGENCY DEPARTMENT Provider Note   CSN: [id number] Arrival date & time: 12/01/19  1214     History Chief Complaint  Patient presents with  . Abdominal Pain    Andre Gibbs is a 34 y.o. male.  Patient is a 34 year old male with history of ulcerative colitis and recent visits for intractable nausea and vomiting.  Patient is a regular user of marijuana, but tells me he has not smoked it in 2-1/2 weeks.  He denies any constipation or diarrhea.  He denies any bloody stool or vomit.  He denies any fevers or chills.  CT scan 2 days ago showed no acute intra-abdominal process.  The history is provided by the patient.  Abdominal Pain Pain location:  Generalized Pain quality: cramping   Pain radiates to:  Does not radiate Pain severity:  Moderate Duration:  2 weeks Timing:  Intermittent Progression:  Worsening Chronicity:  New Relieved by:  Nothing Worsened by:  Palpation and movement Ineffective treatments:  None tried      Past Medical History:  Diagnosis Date  . Allergy     Patient Active Problem List   Diagnosis Date Noted  . Intractable nausea and vomiting 11/19/2019  . Colitis 11/19/2019  . Hypokalemia 11/19/2019  . Tobacco abuse 11/19/2019  . Penile discharge 03/29/2017  . Foreskin problem 03/29/2017  . Screen for STD (sexually transmitted disease) 03/29/2017  . Anxiety 03/29/2017  . Need for influenza vaccination 03/29/2017  . Chronic diarrhea 03/29/2017  . Encounter for health maintenance examination in adult 03/29/2017    History reviewed. No pertinent surgical history.     Family History  Problem Relation Age of Onset  . Stroke Mother        x3  . Hypertension Mother   . Other Father        history unknown  . Hypertension Maternal Grandmother   . Heart disease Maternal Grandmother   . Diabetes Maternal Grandmother   . Cancer Neg Hx     Social History   Tobacco Use  . Smoking status: Current Some Day Smoker   Packs/day: 10.00    Last attempt to quit: 03/01/2013    Years since quitting: 6.7  . Smokeless tobacco: Never Used  Substance Use Topics  . Alcohol use: Yes    Alcohol/week: 2.0 standard drinks    Types: 1 Cans of beer, 1 Shots of liquor per week  . Drug use: No    Home Medications Prior to Admission medications   Medication Sig Start Date End Date Taking? Authorizing Provider  acetaminophen (TYLENOL) 500 MG tablet Take 500 mg by mouth every 6 (six) hours as needed for moderate pain.    [provider]  ondansetron (ZOFRAN) 4 MG tablet Take 1 tablet (4 mg total) by mouth every 8 (eight) hours as needed for nausea or vomiting. 11/22/19 11/21/20  Burnadette Pop, MD  potassium chloride 20 MEQ TBCR Take 20 mEq by mouth daily for 5 days. 11/22/19 11/27/19  Burnadette Pop, MD  promethazine (PHENERGAN) 25 MG tablet Take 1 tablet (25 mg total) by mouth every 8 (eight) hours as needed for nausea or vomiting. 11/29/19   Maxwell Caul, PA-C    Allergies    Patient has no known allergies.  Review of Systems   Review of Systems  Gastrointestinal: Positive for abdominal pain.  All other systems reviewed and are negative.   Physical Exam Updated Vital Signs BP (!) 137/101   Pulse 91   Temp 98.5 F (  36.9 C) (Oral)   Resp 20   Ht 6\' 4"  (1.93 m)   Wt 121 kg   SpO2 100%   BMI 32.47 kg/m   Physical Exam Vitals and nursing note reviewed.  Constitutional:      General: He is not in acute distress.    Appearance: He is well-developed. He is not diaphoretic.  HENT:     Head: Normocephalic and atraumatic.  Cardiovascular:     Rate and Rhythm: Normal rate and regular rhythm.     Heart sounds: No murmur. No friction rub.  Pulmonary:     Effort: Pulmonary effort is normal. No respiratory distress.     Breath sounds: Normal breath sounds. No wheezing or rales.  Abdominal:     General: Bowel sounds are normal. There is no distension.     Palpations: Abdomen is soft.     Tenderness:  There is no abdominal tenderness.  Musculoskeletal:        General: Normal range of motion.     Cervical back: Normal range of motion and neck supple.  Skin:    General: Skin is warm and dry.  Neurological:     Mental Status: He is alert and oriented to person, place, and time.     Coordination: Coordination normal.     ED Results / Procedures / Treatments   Labs (all labs ordered are listed, but only abnormal results are displayed) Labs Reviewed  CBC WITH DIFFERENTIAL/PLATELET  COMPREHENSIVE METABOLIC PANEL  LIPASE, BLOOD    EKG None  Radiology CT ABDOMEN PELVIS W CONTRAST  Result Date: 11/29/2019 CLINICAL DATA:  Recent history of colitis, abdominal pain vomiting 4 days ago EXAM: CT ABDOMEN AND PELVIS WITH CONTRAST TECHNIQUE: Multidetector CT imaging of the abdomen and pelvis was performed using the standard protocol following bolus administration of intravenous contrast. CONTRAST:  140mL OMNIPAQUE IOHEXOL 300 MG/ML  SOLN COMPARISON:  None. FINDINGS: Lower chest: The visualized heart size within normal limits. No pericardial fluid/thickening. No hiatal hernia. The visualized portions of the lungs are clear. Hepatobiliary: The liver is normal in density without focal abnormality.The main portal vein is patent. No evidence of calcified gallstones, gallbladder wall thickening or biliary dilatation. Pancreas: Unremarkable. No pancreatic ductal dilatation or surrounding inflammatory changes. Spleen: Normal in size without focal abnormality. Adrenals/Urinary Tract: Both adrenal glands appear normal. The kidneys and collecting system appear normal without evidence of urinary tract calculus or hydronephrosis. Bladder is unremarkable. Stomach/Bowel: The stomach, small bowel, and colon are normal in appearance. No inflammatory changes, wall thickening, or obstructive findings.The appendix is normal. Vascular/Lymphatic: There are no enlarged mesenteric, retroperitoneal, or pelvic lymph nodes. No  significant vascular findings are present. Reproductive: The prostate is unremarkable. Other: Tiny fat containing anterior umbilical hernia noted. Musculoskeletal: No acute or significant osseous findings. IMPRESSION: No acute intra-abdominal or pelvic pathology to explain the patient's symptoms. Electronically Signed   By: Prudencio Pair M.D.   On: 11/29/2019 19:11    Procedures Procedures (including critical care time)  Medications Ordered in ED Medications  sodium chloride 0.9 % bolus 1,000 mL (has no administration in time range)  ondansetron (ZOFRAN) injection 4 mg (has no administration in time range)  ketorolac (TORADOL) 30 MG/ML injection 30 mg (has no administration in time range)    ED Course  I have reviewed the triage vital signs and the nursing notes.  Pertinent labs & imaging results that were available during my care of the patient were reviewed by me and considered in my  medical decision making (see chart for details).    MDM Rules/Calculators/A&P  Patient presenting here with complaints of recurrent episodic vomiting.  He has been seen here on multiple occasions with similar symptoms.  His abdominal exam today is benign and CT scan from 2 days ago was reviewed and negative.  IV fluids initiated along with antiemetics, however patient continues to vomit.  He will be given additional IV fluids and Reglan and see if this helps.  Care will be signed out to Dr. Particia Nearing at shift change.  She will reassess the patient and determine the final disposition.  I suspect there may be a component of cannabis hyperemesis although the patient does deny having used marijuana in the past 2-1/2 weeks.  Final Clinical Impression(s) / ED Diagnoses Final diagnoses:  None    Rx / DC Orders ED Discharge Orders    None       Geoffery Lyons, MD 12/01/19 1517

## 2019-12-01 NOTE — ED Provider Notes (Signed)
Pt signed out by Dr. Judd Lien pending symptomatic improvement.  Pt is now feeling much better.  He is tolerating liquids and crackers.  He had not picked up his zofran from his prior visit.  Pt is stable for d/c.  Return if worse.  F/u with pcp.   Jacalyn Lefevre, MD 12/01/19 2000

## 2019-12-01 NOTE — ED Triage Notes (Signed)
He has been diagnosed with ulcerative colitis. This am he passed out. He feels dehydrated form vomiting and diarrhea for the past 2 weeks. He has gotten IV fluids multiple times.

## 2019-12-02 ENCOUNTER — Inpatient Hospital Stay (HOSPITAL_COMMUNITY)
Admission: EM | Admit: 2019-12-02 | Discharge: 2019-12-05 | DRG: 387 | Disposition: A | Discharge status: Home / Self Care | Payer: Self-pay | Attending: Internal Medicine | Admitting: Internal Medicine

## 2019-12-02 ENCOUNTER — Other Ambulatory Visit: Payer: Self-pay

## 2019-12-02 ENCOUNTER — Encounter (HOSPITAL_COMMUNITY): Payer: Self-pay

## 2019-12-02 ENCOUNTER — Inpatient Hospital Stay (HOSPITAL_COMMUNITY): Payer: Self-pay

## 2019-12-02 ENCOUNTER — Telehealth: Payer: Self-pay

## 2019-12-02 DIAGNOSIS — Z79899 Other long term (current) drug therapy: Secondary | ICD-10-CM

## 2019-12-02 DIAGNOSIS — Z720 Tobacco use: Secondary | ICD-10-CM

## 2019-12-02 DIAGNOSIS — R112 Nausea with vomiting, unspecified: Secondary | ICD-10-CM | POA: Diagnosis present

## 2019-12-02 DIAGNOSIS — E876 Hypokalemia: Secondary | ICD-10-CM | POA: Diagnosis present

## 2019-12-02 DIAGNOSIS — K567 Ileus, unspecified: Secondary | ICD-10-CM

## 2019-12-02 DIAGNOSIS — F17210 Nicotine dependence, cigarettes, uncomplicated: Secondary | ICD-10-CM | POA: Diagnosis present

## 2019-12-02 DIAGNOSIS — K5190 Ulcerative colitis, unspecified, without complications: Principal | ICD-10-CM | POA: Diagnosis present

## 2019-12-02 DIAGNOSIS — I10 Essential (primary) hypertension: Secondary | ICD-10-CM | POA: Diagnosis present

## 2019-12-02 DIAGNOSIS — F419 Anxiety disorder, unspecified: Secondary | ICD-10-CM | POA: Diagnosis present

## 2019-12-02 DIAGNOSIS — K5100 Ulcerative (chronic) pancolitis without complications: Secondary | ICD-10-CM

## 2019-12-02 DIAGNOSIS — Z20822 Contact with and (suspected) exposure to covid-19: Secondary | ICD-10-CM | POA: Diagnosis present

## 2019-12-02 DIAGNOSIS — Z8249 Family history of ischemic heart disease and other diseases of the circulatory system: Secondary | ICD-10-CM

## 2019-12-02 DIAGNOSIS — F1910 Other psychoactive substance abuse, uncomplicated: Secondary | ICD-10-CM | POA: Diagnosis present

## 2019-12-02 HISTORY — DX: Ulcerative colitis, unspecified, without complications: K51.90

## 2019-12-02 LAB — COMPREHENSIVE METABOLIC PANEL
ALT: 44 U/L (ref 0–44)
AST: 27 U/L (ref 15–41)
Albumin: 4.5 g/dL (ref 3.5–5.0)
Alkaline Phosphatase: 51 U/L (ref 38–126)
Anion gap: 12 (ref 5–15)
BUN: 11 mg/dL (ref 6–20)
CO2: 24 mmol/L (ref 22–32)
Calcium: 9.4 mg/dL (ref 8.9–10.3)
Chloride: 102 mmol/L (ref 98–111)
Creatinine, Ser: 1.05 mg/dL (ref 0.61–1.24)
GFR calc Af Amer: >60 mL/min (ref >60)
GFR calc non Af Amer: >60 mL/min (ref >60)
Glucose, Bld: 98 mg/dL (ref 70–99)
Potassium: 3.2 mmol/L — ABNORMAL LOW (ref 3.5–5.1)
Sodium: 138 mmol/L (ref 135–145)
Total Bilirubin: 0.7 mg/dL (ref 0.3–1.2)
Total Protein: 8 g/dL (ref 6.5–8.1)

## 2019-12-02 LAB — TSH: TSH: 0.454 u[IU]/mL (ref 0.350–4.500)

## 2019-12-02 LAB — CBC
HCT: 44.7 % (ref 39.0–52.0)
Hemoglobin: 15.2 g/dL (ref 13.0–17.0)
MCH: 30.3 pg (ref 26.0–34.0)
MCHC: 34 g/dL (ref 30.0–36.0)
MCV: 89 fL (ref 80.0–100.0)
Platelets: 333 10*3/uL (ref 150–400)
RBC: 5.02 MIL/uL (ref 4.22–5.81)
RDW: 11.6 % (ref 11.5–15.5)
WBC: 7.6 10*3/uL (ref 4.0–10.5)
nRBC: 0 % (ref 0.0–0.2)

## 2019-12-02 LAB — LIPASE, BLOOD: Lipase: 59 U/L — ABNORMAL HIGH (ref 11–51)

## 2019-12-02 LAB — SARS CORONAVIRUS 2 BY RT PCR (HOSPITAL ORDER, PERFORMED IN ~~LOC~~ HOSPITAL LAB): SARS Coronavirus 2: NEGATIVE

## 2019-12-02 LAB — PHOSPHORUS: Phosphorus: 2.7 mg/dL (ref 2.5–4.6)

## 2019-12-02 LAB — MAGNESIUM: Magnesium: 2.3 mg/dL (ref 1.7–2.4)

## 2019-12-02 MED ORDER — POTASSIUM CHLORIDE 10 MEQ/100ML IV SOLN
10.0000 meq | Freq: Once | INTRAVENOUS | Status: AC
  Administered 2019-12-02: 10 meq via INTRAVENOUS
  Filled 2019-12-02: qty 100

## 2019-12-02 MED ORDER — ONDANSETRON 4 MG PO TBDP: 4.0000 mg | ORAL_TABLET | Freq: Once | ORAL | Status: DC | PRN

## 2019-12-02 MED ORDER — SODIUM CHLORIDE 0.9 % IV SOLN: 12.5000 mg | Freq: Once | INTRAVENOUS | Status: DC

## 2019-12-02 MED ORDER — SODIUM CHLORIDE 0.9 % IV BOLUS
1000.0000 mL | Freq: Once | INTRAVENOUS | Status: AC
  Administered 2019-12-02: 1000 mL via INTRAVENOUS

## 2019-12-02 MED ORDER — METOCLOPRAMIDE HCL 5 MG/ML IJ SOLN
10.0000 mg | Freq: Once | INTRAMUSCULAR | Status: AC
  Administered 2019-12-02: 10 mg via INTRAVENOUS
  Filled 2019-12-02: qty 2

## 2019-12-02 MED ORDER — ONDANSETRON HCL 4 MG/2ML IJ SOLN
4.0000 mg | Freq: Four times a day (QID) | INTRAMUSCULAR | Status: DC | PRN
  Administered 2019-12-02 – 2019-12-05 (×5): 4 mg via INTRAVENOUS
  Filled 2019-12-02 (×5): qty 2

## 2019-12-02 MED ORDER — SODIUM CHLORIDE 0.9 % IV SOLN
12.5000 mg | Freq: Four times a day (QID) | INTRAVENOUS | Status: DC | PRN
  Filled 2019-12-02: qty 0.5

## 2019-12-02 MED ORDER — METOPROLOL TARTRATE 5 MG/5ML IV SOLN
5.0000 mg | Freq: Four times a day (QID) | INTRAVENOUS | Status: DC
  Administered 2019-12-02 – 2019-12-05 (×12): 5 mg via INTRAVENOUS
  Filled 2019-12-02 (×12): qty 5

## 2019-12-02 MED ORDER — NICOTINE 21 MG/24HR TD PT24: 21.0000 mg | MEDICATED_PATCH | TRANSDERMAL | Status: DC | PRN

## 2019-12-02 MED ORDER — PROMETHAZINE HCL 25 MG/ML IJ SOLN
12.5000 mg | Freq: Once | INTRAMUSCULAR | Status: AC
  Administered 2019-12-02: 12.5 mg via INTRAVENOUS
  Filled 2019-12-02: qty 1

## 2019-12-02 MED ORDER — MORPHINE SULFATE (PF) 4 MG/ML IV SOLN
4.0000 mg | Freq: Once | INTRAVENOUS | Status: AC
  Administered 2019-12-02: 4 mg via INTRAVENOUS
  Filled 2019-12-02: qty 1

## 2019-12-02 MED ORDER — ENOXAPARIN SODIUM 60 MG/0.6ML ~~LOC~~ SOLN
60.0000 mg | SUBCUTANEOUS | Status: DC
  Administered 2019-12-02 – 2019-12-03 (×2): 60 mg via SUBCUTANEOUS
  Filled 2019-12-02 (×3): qty 0.6

## 2019-12-02 MED ORDER — POTASSIUM CHLORIDE 10 MEQ/100ML IV SOLN
10.0000 meq | INTRAVENOUS | Status: AC
  Administered 2019-12-02 (×4): 10 meq via INTRAVENOUS
  Filled 2019-12-02 (×4): qty 100

## 2019-12-02 MED ORDER — SODIUM CHLORIDE 0.9 % IV SOLN
25.0000 mg | Freq: Once | INTRAVENOUS | Status: AC
  Administered 2019-12-02: 25 mg via INTRAVENOUS
  Filled 2019-12-02: qty 1

## 2019-12-02 MED ORDER — LORAZEPAM 2 MG/ML IJ SOLN: 1.0000 mg | Freq: Four times a day (QID) | INTRAMUSCULAR | Status: DC | PRN

## 2019-12-02 MED ORDER — DEXTROSE-NACL 5-0.9 % IV SOLN: INTRAVENOUS | Status: DC

## 2019-12-02 NOTE — Progress Notes (Signed)
ED TO INPATIENT HANDOFF REPORT  ED Nurse Name and Phone #: Mordche Hedglin [phone]  Name/Age/Gender Andre Gibbs 34 y.o. male Room/Bed: WA19/WA19  Code Status   Code Status: Full Code  Home/SNF/Other Home Patient oriented to: self, place, time and situation Is this baseline? Yes   Triage Complete: Triage complete  Chief Complaint Cannabinoid hyperemesis syndrome [R11.2, F12.90]  Triage Note Pt sts abdominal pain sinnce he was seen at [facility] yesterday. Pt sts Ulcerative Colitis is getting worse.     Allergies No Known Allergies  Level of Care/Admitting Diagnosis ED Disposition    ED Disposition Condition Comment   Admit  Hospital Area: [facility] [[id number]]  Level of Care: Telemetry [5]  Admit to tele based on following criteria: Monitor for Ischemic changes  May admit patient to [facility] or [facility] if equivalent level of care is available:: Yes  Covid Evaluation: Confirmed COVID Negative  Diagnosis: Cannabinoid hyperemesis syndrome [[id number]]  Admitting Physician: Allie Bossier [[id number]]  Attending Physician: Allie Bossier [[id number]]  Estimated length of stay: 3 - 4 days  Certification:: I certify this patient will need inpatient services for at least 2 midnights       B Medical/Surgery History Past Medical History:  Diagnosis Date  . Allergy   . Ulcerative colitis ([place])    History reviewed. No pertinent surgical history.   A IV Location/Drains/Wounds Patient Lines/Drains/Airways Status   Active Line/Drains/Airways    Name:   Placement date:   Placement time:   Site:   Days:   Peripheral IV 12/02/19 Left Antecubital   12/02/19    0850    Antecubital   less than 1          Intake/Output Last 24 hours  Intake/Output Summary (Last 24 hours) at 12/02/2019 1928 Last data filed at 12/02/2019 1726 Gross per 24 hour  Intake 1125 ml  Output --  Net 1125 ml    Labs/Imaging Results for orders placed or performed during the  hospital encounter of 12/02/19 (from the past 48 hour(s))  Lipase, blood     Status: Abnormal   Collection Time: 12/02/19  6:23 AM  Result Value Ref Range   Lipase 59 (H) 11 - 51 U/L    Comment: Performed at [facility], [place] [address] [place], [place] [address]  Comprehensive metabolic panel     Status: Abnormal   Collection Time: 12/02/19  6:23 AM  Result Value Ref Range   Sodium 138 135 - 145 mmol/L   Potassium 3.2 (L) 3.5 - 5.1 mmol/L   Chloride 102 98 - 111 mmol/L   CO2 24 22 - 32 mmol/L   Glucose, Bld 98 70 - 99 mg/dL    Comment: Glucose reference range applies only to samples taken after fasting for at least 8 hours.   BUN 11 6 - 20 mg/dL   Creatinine, Ser 1.05 0.61 - 1.24 mg/dL   Calcium 9.4 8.9 - 10.3 mg/dL   Total Protein 8.0 6.5 - 8.1 g/dL   Albumin 4.5 3.5 - 5.0 g/dL   AST 27 15 - 41 U/L   ALT 44 0 - 44 U/L   Alkaline Phosphatase 51 38 - 126 U/L   Total Bilirubin 0.7 0.3 - 1.2 mg/dL   GFR calc non Af Amer >60 >60 mL/min   GFR calc Af Amer >60 >60 mL/min   Anion gap 12 5 - 15    Comment: Performed at [facility], [address] [place], [place] [address]  CBC     Status: None   Collection Time: 12/02/19  6:23 AM  Result Value Ref Range   WBC 7.6 4.0 - 10.5 K/uL   RBC 5.02 4.22 - 5.81 MIL/uL   Hemoglobin 15.2 13.0 - 17.0 g/dL   HCT 95.2 84.1 - 32.4 %   MCV 89.0 80.0 - 100.0 fL   MCH 30.3 26.0 - 34.0 pg   MCHC 34.0 30.0 - 36.0 g/dL   RDW 40.1 02.7 - 25.3 %   Platelets 333 150 - 400 K/uL   nRBC 0.0 0.0 - 0.2 %    Comment: Performed at [facility], [address] [place], [place] [address]  Magnesium     Status: None   Collection Time: 12/02/19  6:23 AM  Result Value Ref Range   Magnesium 2.3 1.7 - 2.4 mg/dL    Comment: Performed at [facility], [address], [place], [place] [address]  Phosphorus     Status: None   Collection Time: 12/02/19  6:23 AM  Result Value Ref Range    Phosphorus 2.7 2.5 - 4.6 mg/dL    Comment: Performed at [facility], [address], [place], [place] [address]  TSH     Status: None   Collection Time: 12/02/19  6:23 AM  Result Value Ref Range   TSH 0.454 0.350 - 4.500 uIU/mL    Comment: Performed by a 3rd Generation assay with a functional sensitivity of <=0.01 uIU/mL. Performed at [facility], [address], [place], [place] [address]   SARS Coronavirus 2 by RT PCR (hospital order, performed in [facility] hospital lab) Nasopharyngeal Nasopharyngeal Swab     Status: None   Collection Time: 12/02/19  1:42 PM   Specimen: Nasopharyngeal Swab  Result Value Ref Range   SARS Coronavirus 2 NEGATIVE NEGATIVE    Comment: (NOTE) SARS-CoV-2 target nucleic acids are NOT DETECTED. The SARS-CoV-2 RNA is generally detectable in upper and lower respiratory specimens during the acute phase of infection. The lowest concentration of SARS-CoV-2 viral copies this assay can detect is 250 copies / mL. A negative result does not preclude SARS-CoV-2 infection and should not be used as the sole basis for treatment or other patient management decisions.  A negative result may occur with improper specimen collection / handling, submission of specimen other than nasopharyngeal swab, presence of viral mutation(s) within the areas targeted by this assay, and inadequate number of viral copies (<250 copies / mL). A negative result must be combined with clinical observations, patient history, and epidemiological information. Fact Sheet for Patients:   BoilerBrush.com.cy Fact Sheet for Healthcare Providers: https://pope.com/ This test is not yet approved or cleared  by the [place] FDA and has been authorized for detection and/or diagnosis of SARS-CoV-2 by FDA under an Emergency Use Authorization (EUA).  This EUA will remain in effect (meaning this test can be used) for  the duration of the COVID-19 declaration under Section 564(b)(1) of the Act, 21 U.S.C. section 360bbb-3(b)(1), unless the authorization is terminated or revoked sooner. Performed at [facility], [address], [place], [place] [address]    DG Abd 1 View  Result Date: 12/02/2019 CLINICAL DATA:  Abdominal pain EXAM: ABDOMEN - 1 VIEW COMPARISON:  11/29/2019 CT FINDINGS: The bowel gas pattern is normal. No radio-opaque calculi or other significant radiographic abnormality are seen. IMPRESSION: Negative. Electronically Signed   By: Charlett Nose M.D.  On: 12/02/2019 17:14   US Abdomen Complete  Result Date: 12/02/2019 CLINICAL DATA:  Abdominal pain with nausea and vomiting x3 weeks. EXAM: ABDOMEN ULTRASOUND COMPLETE COMPARISON:  None. FINDINGS: Gallbladder: No gallstones or wall thickening visualized (2.0 mm). No sonographic Murphy sign noted by sonographer. Common bile duct: Diameter: 4.2 mm Liver: A 4.7 cm x 2.3 cm x 2.4 cm area of heterogeneous increased echogenicity is seen within the right lobe of the liver. Within normal limits in parenchymal echogenicity. Portal vein is patent on color Doppler imaging with normal direction of blood flow towards the liver. IVC: No abnormality visualized. Pancreas: Visualized portion unremarkable. Spleen: Size (5.8 cm) and appearance within normal limits. Right Kidney: Length: 12.1 cm. Echogenicity within normal limits. No mass or hydronephrosis visualized. Left Kidney: Length: 11.7 cm. Echogenicity within normal limits. No mass or hydronephrosis visualized. Abdominal aorta: No aneurysm visualized. Other findings: None. IMPRESSION: 1. Area of heterogeneous increased echogenicity within the right lobe of the liver which may represent an area of focal fatty infiltration. 2. No evidence of cholelithiasis or acute cholecystitis. Electronically Signed   By: Aram Candela M.D.   On: 12/02/2019 17:54    Pending Labs Unresulted Labs (From  admission, onward)    Start     Ordered   12/09/19 0500  Creatinine, serum  (enoxaparin (LOVENOX)    CrCl >/= 30 ml/min)  Weekly,   R    Comments: while on enoxaparin therapy    12/02/19 1401   12/03/19 0500  CBC WITH DIFFERENTIAL  Daily,   R     12/02/19 1401   12/03/19 0500  Comprehensive metabolic panel  Daily,   R     12/02/19 1401   12/03/19 0500  Magnesium  Daily,   R     12/02/19 1401   12/03/19 0500  Phosphorus  Daily,   R     12/02/19 1401   12/02/19 1400  Urine culture  Once,   STAT     12/02/19 1401   12/02/19 1358  CBC with Differential/Platelet  Once,   STAT     12/02/19 1401   12/02/19 1355  CBC  (enoxaparin (LOVENOX)    CrCl >/= 30 ml/min)  Once,   STAT    Comments: Baseline for enoxaparin therapy IF NOT ALREADY DRAWN.  Notify MD if PLT < 100 K.    12/02/19 1401   12/02/19 0834  Gastrointestinal Panel by PCR , Stool  (Gastrointestinal Panel by PCR, Stool                                                                                                                                                     *Does Not include CLOSTRIDIUM DIFFICILE testing.**If CDIFF testing is needed, select the C Difficile Quick Screen w PCR reflex order below)  Once,   STAT  12/02/19 0834   12/02/19 0834  C Difficile Quick Screen w PCR reflex  (Gastrointestinal Panel by PCR, Stool                                                                                                                                                     *Does Not include CLOSTRIDIUM DIFFICILE testing.**If CDIFF testing is needed, select the C Difficile Quick Screen w PCR reflex order below)  Once, for 24 hours,   STAT     12/02/19 0834   12/02/19 0605  Urinalysis, Routine w reflex microscopic  ONCE - STAT,   STAT     12/02/19 0604          Vitals/Pain Today's Vitals   12/02/19 1803 12/02/19 1830 12/02/19 1900 12/02/19 1916  BP: (!) 150/102 (!) 161/107 (!) 147/97   Pulse: 92 74 77   Resp: 16 19 14    Temp:       TempSrc:      SpO2: 99% 97% 99%   Weight:      Height:      PainSc:    0-No pain    Isolation Precautions Enteric precautions (UV disinfection)  Medications Medications  enoxaparin (LOVENOX) injection 60 mg (has no administration in time range)  dextrose 5 %-0.9 % sodium chloride infusion ( Intravenous New Bag/Given 12/02/19 1654)  ondansetron (ZOFRAN) injection 4 mg (4 mg Intravenous Given 12/02/19 1442)  chlorproMAZINE (THORAZINE) 12.5 mg in sodium chloride 0.9 % 25 mL IVPB (has no administration in time range)  potassium chloride 10 mEq in 100 mL IVPB (10 mEq Intravenous New Bag/Given 12/02/19 1851)  LORazepam (ATIVAN) injection 1-2 mg (has no administration in time range)  metoprolol tartrate (LOPRESSOR) injection 5 mg (5 mg Intravenous Given 12/02/19 1654)  nicotine (NICODERM CQ - dosed in mg/24 hours) patch 21 mg (has no administration in time range)  sodium chloride 0.9 % bolus 1,000 mL (0 mLs Intravenous Stopped 12/02/19 1137)  metoCLOPramide (REGLAN) injection 10 mg (10 mg Intravenous Given 12/02/19 1008)  potassium chloride 10 mEq in 100 mL IVPB (0 mEq Intravenous Stopped 12/02/19 1137)  morphine 4 MG/ML injection 4 mg (4 mg Intravenous Given 12/02/19 1206)  promethazine (PHENERGAN) injection 12.5 mg (12.5 mg Intravenous Given 12/02/19 1205)  chlorproMAZINE (THORAZINE) 25 mg in sodium chloride 0.9 % 25 mL IVPB (0 mg Intravenous Stopped 12/02/19 1726)    Mobility walks Low fall risk   Focused Assessments     Recommendations: See Admitting Provider Note  Report given to: Adreana Coull [phone]  Additional Notes:

## 2019-12-02 NOTE — H&P (Signed)
[facility] History and Physical  Andre Gibbs WLS:[id number] DOB: 04-26-1986 DOA: 12/02/2019  Referring physician:  PCP: Jac Canavan, PA-C   Chief Complaint: Intractable nausea and vomiting  HPI:  34 y.o. male PMHx Anxiety, EtOH abuse, drug abuse (marijuana), ulcerative colitis,  Recently discharged 5/8 treated for intractable nausea/vomiting, colitis, drug abuse (marijuana)   presents today for nausea vomiting diarrhea.  Patient reports that this has been a problem for him for greater than 2 weeks.  He describes multiple episodes of nonbilious emesis occasionally streaked with blood.  Additionally with intermittent nonbloody diarrhea.  He describes abdominal pain as a generalized cramping sensation that does not radiate is constant worsened with vomiting no alleviating factors.  Denies fever/chills, chest pain/shortness of breath, cough/hemoptysis, extremity swelling/color change, numbness/tingling, weakness, fall/injury or any additional concerns.  Of note patient reports that he is not drink alcohol or smoke marijuana in greater than 2 weeks. HPI ADDENDUM; although in patient's chart as a diagnosis patient states he does not have ulcerative colitis.  Has never received a colonoscopy.  No known family members with ulcerative colitis.  States when not in hospital BP WNL   Review of Systems:  Constitutional:  No weight loss, night sweats, Fevers, chills, fatigue.  HEENT:  No headaches, Difficulty swallowing,Tooth/dental problems,Sore throat,  No sneezing, itching, ear ache, nasal congestion, post nasal drip,  Cardio-vascular:  No chest pain, Orthopnea, PND, swelling in lower extremities, anasarca, dizziness, palpitations  GI:  No heartburn, positive indigestion,positive abdominal pain,positive nausea, positivevomiting, negative diarrhea, change in bowel habits, loss of appetite  Resp:  No shortness of breath with exertion or at rest. No excess mucus, no productive cough,  No non-productive cough, No coughing up of blood.No change in color of mucus.No wheezing.No chest wall deformity  Skin:  no rash or lesions.  GU:  no dysuria, change in color of urine, no urgency or frequency. No flank pain.  Musculoskeletal:  No joint pain or swelling. No decreased range of motion. No back pain.  Psych:  No change in mood or affect. No depression or anxiety. No memory loss.   Past Medical History:  Diagnosis Date  . Allergy   . Ulcerative colitis ([facility])    History reviewed. No pertinent surgical history. Social History:  reports that he has been smoking. He has been smoking about 10.00 packs per day. He has never used smokeless tobacco. He reports current alcohol use of about 2.0 standard drinks of alcohol per week. He reports that he does not use drugs.  No Known Allergies  Family History  Problem Relation Age of Onset  . Stroke Mother        x3  . Hypertension Mother   . Other Father        history unknown  . Hypertension Maternal Grandmother   . Heart disease Maternal Grandmother   . Diabetes Maternal Grandmother   . Cancer Neg Hx     Prior to Admission medications   Medication Sig Start Date End Date Taking? Authorizing Provider  acetaminophen (TYLENOL) 500 MG tablet Take 500 mg by mouth every 6 (six) hours as needed for moderate pain.   Yes [provider]  ondansetron (ZOFRAN) 4 MG tablet Take 1 tablet (4 mg total) by mouth every 8 (eight) hours as needed for nausea or vomiting. 12/01/19 11/30/20 Yes Jacalyn Lefevre, MD  promethazine (PHENERGAN) 25 MG tablet Take 1 tablet (25 mg total) by mouth every 8 (eight) hours as needed for nausea or vomiting. 11/29/19  Volanda Napoleon, PA-C     Consultants:    Procedures/Significant Events:  5/15 CT abdomen pelvis W contrast; negative acute intra-abdominal/pelvic  pathology ---------------------------------------------------------------------------------------------------------------------------------------    I have personally reviewed and interpreted all radiology studies and my findings are as above.   VENTILATOR SETTINGS:    Cultures   Antimicrobials:    Devices    LINES / TUBES:      Continuous Infusions:  Physical Exam: Vitals:   12/02/19 1206 12/02/19 1230 12/02/19 1245 12/02/19 1300  BP: (!) 135/103 128/83  134/80  Pulse: (!) 102 87 84 83  Resp: 18 10 (!) 6 14  Temp:      TempSrc:      SpO2: 100% 96% 99% 99%  Weight:      Height:        Wt Readings from Last 3 Encounters:  12/02/19 118.8 kg  12/01/19 121 kg  11/29/19 121 kg    General: A/O x4, no acute respiratory distress Eyes: negative scleral hemorrhage, negative anisocoria, negative icterus ENT: Negative Runny nose, negative gingival bleeding, Neck:  Negative scars, masses, torticollis, lymphadenopathy, JVD Lungs: Clear to auscultation bilaterally without wheezes or crackles Cardiovascular: Regular rate and rhythm without murmur gallop or rub normal S1 and S2 Abdomen: negative abdominal pain, nondistended, negative soft, bowel sounds, no rebound, no ascites, no appreciable mass, positive nausea/vomiting with possible blood vs bile Extremities: No significant cyanosis, clubbing, or edema bilateral lower extremities Skin: Negative rashes, lesions, ulcers Psychiatric:  Negative depression, negative anxiety, negative fatigue, negative mania  Central nervous system:  Cranial nerves II through XII intact, tongue/uvula midline, all extremities muscle strength 5/5, sensation intact throughout, negative dysarthria, negative expressive aphasia, negative receptive aphasia.        Labs on Admission:  Basic Metabolic Panel: Recent Labs  Lab 11/29/19 1507 12/01/19 1332 12/02/19 0623  NA 134* 135 138  K 3.3* 3.4* 3.2*  CL 95* 97* 102  CO2 26 26 24   GLUCOSE  116* 104* 98  BUN 18 10 11   CREATININE 1.04 0.97 1.05  CALCIUM 9.1 8.9 9.4   Liver Function Tests: Recent Labs  Lab 11/29/19 1507 12/01/19 1332 12/02/19 0623  AST 23 21 27   ALT 47* 41 44  ALKPHOS 54 46 51  BILITOT 0.6 0.7 0.7  PROT 7.9 7.2 8.0  ALBUMIN 4.3 4.0 4.5   Recent Labs  Lab 11/29/19 1507 12/01/19 1332 12/02/19 0623  LIPASE 32 26 59*   No results for input(s): AMMONIA in the last 168 hours. CBC: Recent Labs  Lab 11/29/19 1507 12/01/19 1332 12/02/19 0623  WBC 10.2 6.5 7.6  NEUTROABS 6.8 3.6  --   HGB 16.8 14.3 15.2  HCT 46.4 40.6 44.7  MCV 85.0 85.5 89.0  PLT 361 317 333   Cardiac Enzymes: No results for input(s): CKTOTAL, CKMB, CKMBINDEX, TROPONINI in the last 168 hours.  BNP (last 3 results) No results for input(s): BNP in the last 8760 hours.  ProBNP (last 3 results) No results for input(s): PROBNP in the last 8760 hours.  CBG: No results for input(s): GLUCAP in the last 168 hours.  Radiological Exams on Admission: No results found.  EKG: Independently reviewed.  NSR  Assessment/Plan Active Problems:   Anxiety   Intractable nausea and vomiting   Tobacco abuse   Ulcerative colitis ([place])   Drug abuse ([place])   Cannabinoid hyperemesis syndrome   Cannabinoid hyperemesis syndrome([facility]) -Counseled patient extensively on need to discontinue smoking marijuana. -Zofran -Thorazine IV 25 mg now -Thorazine  IV 12.5 mg if Zofran ineffective -D5-0.9% saline 169ml/hr -If patient's symptoms do not resolve in the next 48 hours would consult GI  Intractable nausea and vomiting -5/18 KUB pending -5/18 abdominal ultrasound pending R/O biliary symptoms  Drug abuse -See CHF -Consult to LCSW for drug abuse resources .  HTN 5/18 Metoprolol IV 5 mg QID  Anxiety -Ativan PRN  Tobacco abuse -Nicotine patch PRN  Hypokalemia -Potassium goal> 4 -Potassium IV 50 mEq     Code Status: Full (DVT Prophylaxis: Lovenox Family Communication:    Disposition Plan:  Disposition Plan:  Status is: Inpatient    Dispo: The patient is from: Home              Anticipated d/c is to: Home              Anticipated d/c date is: 27 May              Patient currently unstable     Data Reviewed: Care during the described time interval was provided by me .  I have reviewed this patient's available data, including medical history, events of note, physical examination, and all test results as part of my evaluation.   The patient is critically ill with multiple organ systems failure and requires high complexity decision making for assessment and support, frequent evaluation and titration of therapies, application of advanced monitoring technologies and extensive interpretation of multiple databases. Critical Care Time devoted to patient care services described in this note  Time spent: 70 minutes   Whitman Meinhardt, Roselind Messier [facility] Pager [phone]

## 2019-12-02 NOTE — ED Notes (Signed)
2 Gold tops sent with ordered bloodwork

## 2019-12-02 NOTE — Telephone Encounter (Signed)
Patient called stating he was at the hospital and wanted to know if there was any recommendations of test that should be done while he is at the hospital. Patient was informed that he has not been seen in 2 years at our office and he would need a follow up for further recommendations. Patient stated he will call once he get out the hospital to follow up.

## 2019-12-02 NOTE — ED Notes (Signed)
Patient vomiting/ RN Notified

## 2019-12-02 NOTE — ED Triage Notes (Signed)
Pt sts abdominal pain sinnce he was seen at [facility] yesterday. Pt sts Ulcerative Colitis is getting worse.

## 2019-12-02 NOTE — ED Provider Notes (Signed)
[facility]-EMERGENCY DEPT Provider Note   CSN: [id number] Arrival date & time: 12/02/19  5732     History Chief Complaint  Patient presents with  . Abdominal Pain    Andre Gibbs is a 34 y.o. male presents today for nausea vomiting diarrhea.  Patient reports that this has been a problem for him for greater than 2 weeks.  He describes multiple episodes of nonbilious emesis occasionally streaked with blood.  Additionally with intermittent nonbloody diarrhea.  He describes abdominal pain as a generalized cramping sensation that does not radiate is constant worsened with vomiting no alleviating factors.  Denies fever/chills, chest pain/shortness of breath, cough/hemoptysis, extremity swelling/color change, numbness/tingling, weakness, fall/injury or any additional concerns.  Of note patient reports that he is not drink alcohol or smoke marijuana in greater than 2 weeks. HPI     Past Medical History:  Diagnosis Date  . Allergy   . Ulcerative colitis [facility])     Patient Active Problem List   Diagnosis Date Noted  . Ulcerative colitis (HCC) 12/02/2019  . Drug abuse (HCC) 12/02/2019  . Cannabinoid hyperemesis syndrome 12/02/2019  . Intractable nausea and vomiting 11/19/2019  . Colitis 11/19/2019  . Hypokalemia 11/19/2019  . Tobacco abuse 11/19/2019  . Penile discharge 03/29/2017  . Foreskin problem 03/29/2017  . Screen for STD (sexually transmitted disease) 03/29/2017  . Anxiety 03/29/2017  . Need for influenza vaccination 03/29/2017  . Chronic diarrhea 03/29/2017  . Encounter for health maintenance examination in adult 03/29/2017    History reviewed. No pertinent surgical history.     Family History  Problem Relation Age of Onset  . Stroke Mother        x3  . Hypertension Mother   . Other Father        history unknown  . Hypertension Maternal Grandmother   . Heart disease Maternal Grandmother   . Diabetes Maternal Grandmother   . Cancer Neg Hx       Social History   Tobacco Use  . Smoking status: Current Some Day Smoker    Packs/day: 10.00    Last attempt to quit: 03/01/2013    Years since quitting: 6.7  . Smokeless tobacco: Never Used  Substance Use Topics  . Alcohol use: Yes    Alcohol/week: 2.0 standard drinks    Types: 1 Cans of beer, 1 Shots of liquor per week  . Drug use: No    Home Medications Prior to Admission medications   Medication Sig Start Date End Date Taking? Authorizing Provider  acetaminophen (TYLENOL) 500 MG tablet Take 500 mg by mouth every 6 (six) hours as needed for moderate pain.   Yes [provider]  ondansetron (ZOFRAN) 4 MG tablet Take 1 tablet (4 mg total) by mouth every 8 (eight) hours as needed for nausea or vomiting. 12/01/19 11/30/20 Yes Jacalyn Lefevre, MD  promethazine (PHENERGAN) 25 MG tablet Take 1 tablet (25 mg total) by mouth every 8 (eight) hours as needed for nausea or vomiting. 11/29/19   Maxwell Caul, PA-C    Allergies    Patient has no known allergies.  Review of Systems   Review of Systems Ten systems are reviewed and are negative for acute change except as noted in the HPI  Physical Exam Updated Vital Signs BP 134/80   Pulse 83   Temp 98.9 F (37.2 C) (Oral)   Resp 14   Ht 6\' 4"  (1.93 m)   Wt 118.8 kg   SpO2 99%  BMI 31.89 kg/m   Physical Exam Constitutional:      General: He is not in acute distress.    Appearance: Normal appearance. He is well-developed. He is not ill-appearing or diaphoretic.  HENT:     Head: Normocephalic and atraumatic.     Right Ear: External ear normal.     Left Ear: External ear normal.     Nose: Nose normal.  Eyes:     General: Vision grossly intact. Gaze aligned appropriately.     Pupils: Pupils are equal, round, and reactive to light.  Neck:     Trachea: Trachea and phonation normal. No tracheal deviation.  Pulmonary:     Effort: Pulmonary effort is normal. No respiratory distress.  Abdominal:     General: There is  no distension.     Palpations: Abdomen is soft.     Tenderness: There is generalized abdominal tenderness. There is no guarding or rebound. Negative signs include Murphy's sign and McBurney's sign.  Musculoskeletal:        General: Normal range of motion.     Cervical back: Normal range of motion.  Skin:    General: Skin is warm and dry.  Neurological:     Mental Status: He is alert.     GCS: GCS eye subscore is 4. GCS verbal subscore is 5. GCS motor subscore is 6.     Comments: Speech is clear and goal oriented, follows commands Major Cranial nerves without deficit, no facial droop Moves extremities without ataxia, coordination intact  Psychiatric:        Behavior: Behavior normal.     ED Results / Procedures / Treatments   Labs (all labs ordered are listed, but only abnormal results are displayed) Labs Reviewed  LIPASE, BLOOD - Abnormal; Notable for the following components:      Result Value   Lipase 59 (*)    All other components within normal limits  COMPREHENSIVE METABOLIC PANEL - Abnormal; Notable for the following components:   Potassium 3.2 (*)    All other components within normal limits  GASTROINTESTINAL PANEL BY PCR, STOOL (REPLACES STOOL CULTURE)  C DIFFICILE QUICK SCREEN W PCR REFLEX  SARS CORONAVIRUS 2 BY RT PCR (HOSPITAL ORDER, PERFORMED IN [facility])  CBC  URINALYSIS, ROUTINE W REFLEX MICROSCOPIC    EKG None  Radiology No results found.  Procedures Procedures (including critical care time)  Medications Ordered in ED Medications  sodium chloride 0.9 % bolus 1,000 mL (0 mLs Intravenous Stopped 12/02/19 1137)  metoCLOPramide (REGLAN) injection 10 mg (10 mg Intravenous Given 12/02/19 1008)  potassium chloride 10 mEq in 100 mL IVPB (0 mEq Intravenous Stopped 12/02/19 1137)  morphine 4 MG/ML injection 4 mg (4 mg Intravenous Given 12/02/19 1206)  promethazine (PHENERGAN) injection 12.5 mg (12.5 mg Intravenous Given 12/02/19 1205)    ED  Course  I have reviewed the triage vital signs and the nursing notes.  Pertinent labs & imaging results that were available during my care of the patient were reviewed by me and considered in my medical decision making (see chart for details).  Clinical Course as of Dec 01 1353  Tue Dec 02, 2019  1338 Dr. Joseph Art   [BM]    Clinical Course User Index [BM] Elizabeth Palau   MDM Rules/Calculators/A&P                      Additional History Obtained: 1. Nursing notes from this visit. 2. Prior  ED visits  Patient was initially seen on Nov 18, 2019 for abdominal pain, diagnosis of colitis.  He was discharged on Cipro/Flagyl and promethazine.  He was then seen again in the ER on May 5 and admitted for intractable nausea and vomiting, he was then discharged on May 8 with continued p.o. antibiotics Cipro and Flagyl.  Patient was then seen again on May 15 at that time he had a negative CT scan and symptoms improved and patient was able to tolerate p.o., possibly this was thought to be due to marijuana use and he was discharged with Phenergan.  Patient was seen again in the ER yesterday May 17, had reassuring lab work and after receiving Zofran and Reglan was tolerating p.o. and was again discharged.  Patient mention to triage nurse that he had a diagnosis of ulcerative colitis?  I cannot find any colonoscopy or any GI notes referring to a specific diagnosis of ulcerative colitis.  Perhaps he is referring to the colitis that was seen on original CT scan on May 4. - Patient arrives with continued nausea vomiting and diarrhea.  Reports diarrhea has been resolving, no blood in his diarrhea.  He is still unable to tolerate p.o. feels that the antiemetics only working given through IV.  Denies any alcohol or marijuana use in over 2 weeks.  His abdomen is minimally tender generally however no focal tenderness.  I doubt diverticulitis or appendicitis at this time.  Will obtain blood work including CBC,  CMP, lipase in addition to urinalysis.  Also will order stool studies including C. difficile as patient with multiple ER visits, admission and antibiotic use recently.  Overall lower suspicion for infectious colitis at this time as this appears to be resolving and he has no bloody diarrhea.  Additionally will give IV fluids as well as Reglan as this appears to have worked during his visit yesterday.  Will obtain EKG to evaluate QT.  I have ordered, reviewed and interpreted the following labs.  CBC within normal limits no leukocytosis to suggest infection no evidence of anemia.  Lipase minimally elevated at 59.  CMP shows mild hypokalemia 3.2, no emergent electrolyte derangement, evidence of acute kidney injury or elevation of LFTs. - Patient was given 1 L IV fluid as well as 1 run potassium and Reglan.  On reassessment he still endorsing nausea, he was subsequently given Phenergan. -  On reassessment still unable to tolerate p.o., will consult hospitalist for admission for intractable nausea and vomiting.  On reassessment he denies any abdominal pain, abdomen soft nontender doubt appendicitis or other emergent pathologies requiring CT imaging in the emergent department.  Possible this is cyclical nausea vomiting from previous marijuana use. - Discussed case with Dr. Sherral Hammers at 1:38 PM, patient has been accepted to hospitalist service.   Note: Portions of this report may have been transcribed using voice recognition software. Every effort was made to ensure accuracy; however, inadvertent computerized transcription errors may still be present. Final Clinical Impression(s) / ED Diagnoses Final diagnoses:  Intractable nausea and vomiting    Rx / DC Orders ED Discharge Orders    None       Gari Crown 12/02/19 1356    Charlesetta Shanks, MD 12/03/19 [phone]

## 2019-12-03 DIAGNOSIS — R112 Nausea with vomiting, unspecified: Secondary | ICD-10-CM

## 2019-12-03 LAB — CBC WITH DIFFERENTIAL/PLATELET
Abs Immature Granulocytes: 0.04 10*3/uL (ref 0.00–0.07)
Basophils Absolute: 0 10*3/uL (ref 0.0–0.1)
Basophils Relative: 1 %
Eosinophils Absolute: 0.3 10*3/uL (ref 0.0–0.5)
Eosinophils Relative: 4 %
HCT: 38.4 % — ABNORMAL LOW (ref 39.0–52.0)
Hemoglobin: 12.9 g/dL — ABNORMAL LOW (ref 13.0–17.0)
Immature Granulocytes: 1 %
Lymphocytes Relative: 34 %
Lymphs Abs: 2.2 10*3/uL (ref 0.7–4.0)
MCH: 30.1 pg (ref 26.0–34.0)
MCHC: 33.6 g/dL (ref 30.0–36.0)
MCV: 89.5 fL (ref 80.0–100.0)
Monocytes Absolute: 0.8 10*3/uL (ref 0.1–1.0)
Monocytes Relative: 12 %
Neutro Abs: 3.1 10*3/uL (ref 1.7–7.7)
Neutrophils Relative %: 48 %
Platelets: 283 10*3/uL (ref 150–400)
RBC: 4.29 MIL/uL (ref 4.22–5.81)
RDW: 11.6 % (ref 11.5–15.5)
WBC: 6.4 10*3/uL (ref 4.0–10.5)
nRBC: 0 % (ref 0.0–0.2)

## 2019-12-03 LAB — GASTROINTESTINAL PANEL BY PCR, STOOL (REPLACES STOOL CULTURE)

## 2019-12-03 LAB — COMPREHENSIVE METABOLIC PANEL
ALT: 32 U/L (ref 0–44)
AST: 18 U/L (ref 15–41)
Albumin: 3.4 g/dL — ABNORMAL LOW (ref 3.5–5.0)
Alkaline Phosphatase: 42 U/L (ref 38–126)
Anion gap: 6 (ref 5–15)
BUN: 8 mg/dL (ref 6–20)
CO2: 25 mmol/L (ref 22–32)
Calcium: 8.3 mg/dL — ABNORMAL LOW (ref 8.9–10.3)
Chloride: 106 mmol/L (ref 98–111)
Creatinine, Ser: 0.83 mg/dL (ref 0.61–1.24)
GFR calc Af Amer: >60 mL/min (ref >60)
GFR calc non Af Amer: >60 mL/min (ref >60)
Glucose, Bld: 100 mg/dL — ABNORMAL HIGH (ref 70–99)
Potassium: 3.6 mmol/L (ref 3.5–5.1)
Sodium: 137 mmol/L (ref 135–145)
Total Bilirubin: 0.6 mg/dL (ref 0.3–1.2)
Total Protein: 6 g/dL — ABNORMAL LOW (ref 6.5–8.1)

## 2019-12-03 LAB — C DIFFICILE QUICK SCREEN W PCR REFLEX
C Diff antigen: NEGATIVE
C Diff interpretation: NOT DETECTED
C Diff toxin: NEGATIVE

## 2019-12-03 LAB — MAGNESIUM: Magnesium: 1.9 mg/dL (ref 1.7–2.4)

## 2019-12-03 LAB — PHOSPHORUS: Phosphorus: 4.1 mg/dL (ref 2.5–4.6)

## 2019-12-03 MED ORDER — PROMETHAZINE HCL 25 MG/ML IJ SOLN
12.5000 mg | Freq: Four times a day (QID) | INTRAMUSCULAR | Status: DC | PRN
  Administered 2019-12-03 – 2019-12-05 (×4): 12.5 mg via INTRAVENOUS
  Filled 2019-12-03 (×5): qty 1

## 2019-12-03 MED ORDER — PANTOPRAZOLE SODIUM 40 MG IV SOLR
40.0000 mg | INTRAVENOUS | Status: DC
  Administered 2019-12-03 – 2019-12-04 (×2): 40 mg via INTRAVENOUS
  Filled 2019-12-03 (×2): qty 40

## 2019-12-03 MED ORDER — SODIUM CHLORIDE 0.9 % IV SOLN
25.0000 mg | Freq: Once | INTRAVENOUS | Status: DC
  Filled 2019-12-03: qty 1

## 2019-12-03 MED ORDER — HYDRALAZINE HCL 20 MG/ML IJ SOLN
5.0000 mg | Freq: Four times a day (QID) | INTRAMUSCULAR | Status: DC | PRN
  Filled 2019-12-03: qty 1

## 2019-12-03 MED ORDER — METOCLOPRAMIDE HCL 5 MG/ML IJ SOLN
10.0000 mg | Freq: Once | INTRAMUSCULAR | Status: AC | PRN
  Administered 2019-12-03: 10 mg via INTRAVENOUS
  Filled 2019-12-03: qty 2

## 2019-12-03 NOTE — Progress Notes (Signed)
Pt with nausea after clear liquids for lunch. Had chicken broth, ginger ale, and a mango [place] ice. Zofran administered, 10 minutes later pt vomiting in room. Requested ginger ale. 1 episode loose stool today. MD Dayna Barker made aware.

## 2019-12-03 NOTE — Progress Notes (Signed)
PROGRESS NOTE    Andre Gibbs  DDU:[id number] DOB: 1986/01/02 DOA: 12/02/2019 PCP: Jac Canavan, PA-C   Chef Complaints: Nausea and  vomiting Brief Narrative: 34 year old man with history of anxiety, alcohol abuse, drug use with marijuana, recently discharged on 5/8 after being treated for intractable nausea vomiting colitis drug abuse presented 5/18 with intractable nausea vomiting and diarrhea.  He has had multiple bowel episodes of nonbilious emesis occasionally streaked with blood and nonbloody diarrhea and cramping abdominal pain.  Patient had CT scan of 5/18 that was negative for any acute finding.  Subjective: This morning no more nausea and vomiting and wants to try some diet.  Assessment & Plan:  Intractable nausea and vomiting suspecting cannabinoid hyperemesis syndrome: Symptoms improving will start on clear liquid diet and advance as tolerated continue current antinausea medicine Zofran, Thorazine, IV and hydration and symptomatic management.  GI panel pending and C. difficile was back and negative.  Anxiety: Ativan as needed moderately stable.  Essential hypertension continue metoprolol.  Blood pressure stable  Tobacco abuse: Cessation advised  Drug abuse: We discussed about cannabinoid cessation, verbalized understanding  DVT prophylaxislovenox Code Status: full  Family Communication: plan of care discussed with patient at bedside. Status is: Inpatient  Remains inpatient appropriate because:IV treatments appropriate due to intensity of illness or inability to take PO and Inpatient level of care appropriate due to severity of illness   Dispo: The patient is from: Home              Anticipated d/c is to: Home              Anticipated d/c date is: 1 day              Patient currently is not medically stable to d/c.  Diet Order            Diet clear liquid Room service appropriate? Yes; Fluid consistency: Thin  Diet effective now              Body mass index  is 31.89 kg/m.  Consultants:see note  Procedures:see note Microbiology:see note  Medications: Scheduled Meds: . enoxaparin (LOVENOX) injection  60 mg Subcutaneous Q24H  . metoprolol tartrate  5 mg Intravenous Q6H   Continuous Infusions: . chlorproMAZINE (THORAZINE) IV    . dextrose 5 % and 0.9% NaCl 100 mL/hr at 12/03/19 2376    Antimicrobials: Anti-infectives (From admission, onward)   None       Objective: Vitals: Today's Vitals   12/03/19 0012 12/03/19 0420 12/03/19 0739 12/03/19 0925  BP: 127/78 132/86 130/87   Pulse: 73 77 70   Resp: 20 18    Temp: 98.6 F (37 C) 98.5 F (36.9 C) 98.4 F (36.9 C)   TempSrc: Oral Oral Oral   SpO2: 99% 100% 100%   Weight:      Height:      PainSc:    0-No pain    Intake/Output Summary (Last 24 hours) at 12/03/2019 1033 Last data filed at 12/03/2019 0843 Gross per 24 hour  Intake 1125 ml  Output --  Net 1125 ml   Filed Weights   12/02/19 0603  Weight: 118.8 kg   Weight change:    Intake/Output from previous day: 05/18 0701 - 05/19 0700 In: 1125 [IV Piggyback:1125] Out: -  Intake/Output this shift: No intake/output data recorded.  Examination:  General exam: AAOx3, muscular,NAD, weak appearing. HEENT:Oral mucosa moist, Ear/Nose WNL grossly,dentition normal. Respiratory system: bilaterally clear,no wheezing or crackles,no use of  accessory muscle, non tender. Cardiovascular system: S1 & S2 +,regular, No JVD. Gastrointestinal system: Abdomen soft, NT,ND, BS+. Nervous System:Alert, awake, moving extremities and grossly nonfocal Extremities: No edema, distal peripheral pulses palpable.  Skin: No rashes,no icterus. MSK: Normal muscle bulk,tone, power  Data Reviewed: I have personally reviewed following labs and imaging studies CBC: Recent Labs  Lab 11/29/19 1507 12/01/19 1332 12/02/19 0623 12/03/19 0446  WBC 10.2 6.5 7.6 6.4  NEUTROABS 6.8 3.6  --  3.1  HGB 16.8 14.3 15.2 12.9*  HCT 46.4 40.6 44.7 38.4*   MCV 85.0 85.5 89.0 89.5  PLT 361 317 333 283   Basic Metabolic Panel: Recent Labs  Lab 11/29/19 1507 12/01/19 1332 12/02/19 0623 12/03/19 0446  NA 134* 135 138 137  K 3.3* 3.4* 3.2* 3.6  CL 95* 97* 102 106  CO2 26 26 24 25   GLUCOSE 116* 104* 98 100*  BUN 18 10 11 8   CREATININE 1.04 0.97 1.05 0.83  CALCIUM 9.1 8.9 9.4 8.3*  MG  --   --  2.3 1.9  PHOS  --   --  2.7 4.1   GFR: Estimated Creatinine Clearance: 181.7 mL/min (by C-G formula based on SCr of 0.83 mg/dL). Liver Function Tests: Recent Labs  Lab 11/29/19 1507 12/01/19 1332 12/02/19 0623 12/03/19 0446  AST 23 21 27 18   ALT 47* 41 44 32  ALKPHOS 54 46 51 42  BILITOT 0.6 0.7 0.7 0.6  PROT 7.9 7.2 8.0 6.0*  ALBUMIN 4.3 4.0 4.5 3.4*   Recent Labs  Lab 11/29/19 1507 12/01/19 1332 12/02/19 0623  LIPASE 32 26 59*   No results for input(s): AMMONIA in the last 168 hours. Coagulation Profile: No results for input(s): INR, PROTIME in the last 168 hours. Cardiac Enzymes: No results for input(s): CKTOTAL, CKMB, CKMBINDEX, TROPONINI in the last 168 hours. BNP (last 3 results) No results for input(s): PROBNP in the last 8760 hours. HbA1C: No results for input(s): HGBA1C in the last 72 hours. CBG: No results for input(s): GLUCAP in the last 168 hours. Lipid Profile: No results for input(s): CHOL, HDL, LDLCALC, TRIG, CHOLHDL, LDLDIRECT in the last 72 hours. Thyroid Function Tests: Recent Labs    12/02/19 0623  TSH 0.454   Anemia Panel: No results for input(s): VITAMINB12, FOLATE, FERRITIN, TIBC, IRON, RETICCTPCT in the last 72 hours. Sepsis Labs: No results for input(s): PROCALCITON, LATICACIDVEN in the last 168 hours.  Recent Results (from the past 240 hour(s))  SARS Coronavirus 2 by RT PCR (hospital order, performed in [facility] hospital lab) Nasopharyngeal Nasopharyngeal Swab     Status: None   Collection Time: 12/02/19  1:42 PM   Specimen: Nasopharyngeal Swab  Result Value Ref Range Status   SARS  Coronavirus 2 NEGATIVE NEGATIVE Final    Comment: (NOTE) SARS-CoV-2 target nucleic acids are NOT DETECTED. The SARS-CoV-2 RNA is generally detectable in upper and lower respiratory specimens during the acute phase of infection. The lowest concentration of SARS-CoV-2 viral copies this assay can detect is 250 copies / mL. A negative result does not preclude SARS-CoV-2 infection and should not be used as the sole basis for treatment or other patient management decisions.  A negative result may occur with improper specimen collection / handling, submission of specimen other than nasopharyngeal swab, presence of viral mutation(s) within the areas targeted by this assay, and inadequate number of viral copies (<250 copies / mL). A negative result must be combined with clinical observations, patient history, and epidemiological information. Fact  Sheet for Patients:   StrictlyIdeas.no Fact Sheet for Healthcare Providers: BankingDealers.co.za This test is not yet approved or cleared  by the [place] FDA and has been authorized for detection and/or diagnosis of SARS-CoV-2 by FDA under an Emergency Use Authorization (EUA).  This EUA will remain in effect (meaning this test can be used) for the duration of the COVID-19 declaration under Section 564(b)(1) of the Act, 21 U.S.C. section 360bbb-3(b)(1), unless the authorization is terminated or revoked sooner. Performed at [facility], [place] [address] [place], [place] [address]   C Difficile Quick Screen w PCR reflex     Status: None   Collection Time: 12/03/19  8:44 AM   Specimen: STOOL  Result Value Ref Range Status   C Diff antigen NEGATIVE NEGATIVE Final   C Diff toxin NEGATIVE NEGATIVE Final   C Diff interpretation No C. difficile detected.  Final    Comment: Performed at [facility], [place] [address], [place], [place] [address]      Radiology  Studies: DG Abd 1 View  Result Date: 12/02/2019 CLINICAL DATA:  Abdominal pain EXAM: ABDOMEN - 1 VIEW COMPARISON:  11/29/2019 CT FINDINGS: The bowel gas pattern is normal. No radio-opaque calculi or other significant radiographic abnormality are seen. IMPRESSION: Negative. Electronically Signed   By: Rolm Baptise M.D.   On: 12/02/2019 17:14   US Abdomen Complete  Result Date: 12/02/2019 CLINICAL DATA:  Abdominal pain with nausea and vomiting x3 weeks. EXAM: ABDOMEN ULTRASOUND COMPLETE COMPARISON:  None. FINDINGS: Gallbladder: No gallstones or wall thickening visualized (2.0 mm). No sonographic Murphy sign noted by sonographer. Common bile duct: Diameter: 4.2 mm Liver: A 4.7 cm x 2.3 cm x 2.4 cm area of heterogeneous increased echogenicity is seen within the right lobe of the liver. Within normal limits in parenchymal echogenicity. Portal vein is patent on color Doppler imaging with normal direction of blood flow towards the liver. IVC: No abnormality visualized. Pancreas: Visualized portion unremarkable. Spleen: Size (5.8 cm) and appearance within normal limits. Right Kidney: Length: 12.1 cm. Echogenicity within normal limits. No mass or hydronephrosis visualized. Left Kidney: Length: 11.7 cm. Echogenicity within normal limits. No mass or hydronephrosis visualized. Abdominal aorta: No aneurysm visualized. Other findings: None. IMPRESSION: 1. Area of heterogeneous increased echogenicity within the right lobe of the liver which may represent an area of focal fatty infiltration. 2. No evidence of cholelithiasis or acute cholecystitis. Electronically Signed   By: Virgina Norfolk M.D.   On: 12/02/2019 17:54     LOS: 1 day   Antonieta Pert, MD [facility]  12/03/2019, 10:33 AM

## 2019-12-04 DIAGNOSIS — F1290 Cannabis use, unspecified, uncomplicated: Secondary | ICD-10-CM

## 2019-12-04 LAB — PHOSPHORUS: Phosphorus: 3.9 mg/dL (ref 2.5–4.6)

## 2019-12-04 LAB — MAGNESIUM: Magnesium: 2 mg/dL (ref 1.7–2.4)

## 2019-12-04 LAB — CBC WITH DIFFERENTIAL/PLATELET
Abs Immature Granulocytes: 0.04 10*3/uL (ref 0.00–0.07)
Basophils Absolute: 0 10*3/uL (ref 0.0–0.1)
Basophils Relative: 0 %
Eosinophils Absolute: 0.2 10*3/uL (ref 0.0–0.5)
Eosinophils Relative: 3 %
HCT: 38 % — ABNORMAL LOW (ref 39.0–52.0)
Hemoglobin: 13 g/dL (ref 13.0–17.0)
Immature Granulocytes: 1 %
Lymphocytes Relative: 28 %
Lymphs Abs: 2.3 10*3/uL (ref 0.7–4.0)
MCH: 30.2 pg (ref 26.0–34.0)
MCHC: 34.2 g/dL (ref 30.0–36.0)
MCV: 88.2 fL (ref 80.0–100.0)
Monocytes Absolute: 0.9 10*3/uL (ref 0.1–1.0)
Monocytes Relative: 11 %
Neutro Abs: 4.8 10*3/uL (ref 1.7–7.7)
Neutrophils Relative %: 57 %
Platelets: 321 10*3/uL (ref 150–400)
RBC: 4.31 MIL/uL (ref 4.22–5.81)
RDW: 11.4 % — ABNORMAL LOW (ref 11.5–15.5)
WBC: 8.3 10*3/uL (ref 4.0–10.5)
nRBC: 0 % (ref 0.0–0.2)

## 2019-12-04 LAB — COMPREHENSIVE METABOLIC PANEL
ALT: 30 U/L (ref 0–44)
AST: 17 U/L (ref 15–41)
Albumin: 3.7 g/dL (ref 3.5–5.0)
Alkaline Phosphatase: 45 U/L (ref 38–126)
Anion gap: 11 (ref 5–15)
BUN: 6 mg/dL (ref 6–20)
CO2: 23 mmol/L (ref 22–32)
Calcium: 8.5 mg/dL — ABNORMAL LOW (ref 8.9–10.3)
Chloride: 102 mmol/L (ref 98–111)
Creatinine, Ser: 0.81 mg/dL (ref 0.61–1.24)
GFR calc Af Amer: >60 mL/min (ref >60)
GFR calc non Af Amer: >60 mL/min (ref >60)
Glucose, Bld: 107 mg/dL — ABNORMAL HIGH (ref 70–99)
Potassium: 3.3 mmol/L — ABNORMAL LOW (ref 3.5–5.1)
Sodium: 136 mmol/L (ref 135–145)
Total Bilirubin: 0.8 mg/dL (ref 0.3–1.2)
Total Protein: 6.4 g/dL — ABNORMAL LOW (ref 6.5–8.1)

## 2019-12-04 MED ORDER — POTASSIUM CHLORIDE CRYS ER 20 MEQ PO TBCR: 40.0000 meq | EXTENDED_RELEASE_TABLET | Freq: Once | ORAL | Status: DC

## 2019-12-04 NOTE — Progress Notes (Signed)
PROGRESS NOTE    Andre Gibbs  EVO:[id number] DOB: 03/22/1986 DOA: 12/02/2019 PCP: Jac Canavan, PA-C   Chef Complaints: nausea/vomitting Brief Narrative: 34 year old man with history of anxiety, alcohol abuse, drug use with marijuana, recently discharged on 5/8 after being treated for intractable nausea vomiting colitis drug abuse presented 5/18 with intractable nausea vomiting and diarrhea.  He has had multiple bowel episodes of nonbilious emesis occasionally streaked with blood and nonbloody diarrhea and cramping abdominal pain.  Patient had CT scan of 5/18 that was negative for any acute finding.  Subjective: C/o nausea. No more vomitting, last Bm yesterday vomited for 1-2 hrs after CLD diet yesterday am and npo since.  Assessment & Plan:  Intractable nausea and vomitting, still symptomatic. suspecting cannabinoid hyperemesis syndrome: vomited for 1-2 hrs after CLD diet yesterday am and npo since then, cont ice chips. Cont  Zofran, Thorazine, IV and hydration and symptomatic management.  GI panel pending and C. difficile was back and negative.  Hypokalemia due to poor oral intake and vomiting.  Will be repleted.  Monitor.  Anxiety continue Ativan and anxiety medicine.  Essential hypertension: BP is controlled continue metoprolol.  Monitor.  Tobacco use- advise cessation  Drug abuse: We discussed about cannabinoid cessation, verbalized understanding  DVT prophylaxislovenox Code Status: full  Family Communication: plan of care discussed with patient at bedside. Status is: Inpatient  Remains inpatient appropriate because:IV treatments appropriate due to intensity of illness or inability to take PO and Inpatient level of care appropriate due to severity of illness   Dispo: The patient is from: Home              Anticipated d/c is to: Home              Anticipated d/c date is 2 days              Patient currently is not medically stable to d/c.  Diet Order            Diet  NPO time specified Except for: Ice Chips, Sips with Meds  Diet effective now              Body mass index is 31.89 kg/m.  Consultants:see note  Procedures:see note Microbiology:see note  Medications: Scheduled Meds: . enoxaparin (LOVENOX) injection  60 mg Subcutaneous Q24H  . metoprolol tartrate  5 mg Intravenous Q6H  . pantoprazole (PROTONIX) IV  40 mg Intravenous Q24H  . potassium chloride  40 mEq Oral Once   Continuous Infusions: . chlorproMAZINE (THORAZINE) IV    . chlorproMAZINE (THORAZINE) IV    . dextrose 5 % and 0.9% NaCl 100 mL/hr at 12/04/19 2993    Antimicrobials: Anti-infectives (From admission, onward)   None       Objective: Vitals: Today's Vitals   12/03/19 1559 12/03/19 2000 12/03/19 2046 12/04/19 0520  BP: (!) 147/100  (!) 170/95 (!) 146/86  Pulse:   82 77  Resp:   18 16  Temp:   99.4 F (37.4 C) 98.8 F (37.1 C)  TempSrc:   Oral Oral  SpO2:   94% 94%  Weight:      Height:      PainSc:  0-No pain      Intake/Output Summary (Last 24 hours) at 12/04/2019 1016 Last data filed at 12/04/2019 0600 Gross per 24 hour  Intake 3572.65 ml  Output -  Net 3572.65 ml   Filed Weights   12/02/19 0603  Weight: 118.8 kg   Weight change:  Intake/Output from previous day: 05/19 0701 - 05/20 0700 In: 3572.7 [I.V.:3572.7] Out: -  Intake/Output this shift: No intake/output data recorded.  Examination:  General exam: AAOx3, obese , NAD, weak appearing. HEENT:Oral mucosa moist, Ear/Nose WNL grossly, dentition normal. Respiratory system: bilaterally clear,no wheezing or crackles,no use of accessory muscle Cardiovascular system: S1 & S2 +, No JVD,. Gastrointestinal system: Abdomen soft, mild epigastric T,enderness, ND, BS+ Nervous System:Alert, awake, moving extremities and grossly nonfocal Extremities: No edema, distal peripheral pulses palpable.  Skin: No rashes,no icterus. MSK: Normal muscle bulk,tone, power   Data Reviewed: I have  personally reviewed following labs and imaging studies CBC: Recent Labs  Lab 11/29/19 1507 12/01/19 1332 12/02/19 0623 12/03/19 0446 12/04/19 0511  WBC 10.2 6.5 7.6 6.4 8.3  NEUTROABS 6.8 3.6  --  3.1 4.8  HGB 16.8 14.3 15.2 12.9* 13.0  HCT 46.4 40.6 44.7 38.4* 38.0*  MCV 85.0 85.5 89.0 89.5 88.2  PLT 361 317 333 283 321   Basic Metabolic Panel: Recent Labs  Lab 11/29/19 1507 12/01/19 1332 12/02/19 0623 12/03/19 0446 12/04/19 0511  NA 134* 135 138 137 136  K 3.3* 3.4* 3.2* 3.6 3.3*  CL 95* 97* 102 106 102  CO2 26 26 24 25 23   GLUCOSE 116* 104* 98 100* 107*  BUN 18 10 11 8 6   CREATININE 1.04 0.97 1.05 0.83 0.81  CALCIUM 9.1 8.9 9.4 8.3* 8.5*  MG  --   --  2.3 1.9 2.0  PHOS  --   --  2.7 4.1 3.9   GFR: Estimated Creatinine Clearance: 186.2 mL/min (by C-G formula based on SCr of 0.81 mg/dL). Liver Function Tests: Recent Labs  Lab 11/29/19 1507 12/01/19 1332 12/02/19 0623 12/03/19 0446 12/04/19 0511  AST 23 21 27 18 17   ALT 47* 41 44 32 30  ALKPHOS 54 46 51 42 45  BILITOT 0.6 0.7 0.7 0.6 0.8  PROT 7.9 7.2 8.0 6.0* 6.4*  ALBUMIN 4.3 4.0 4.5 3.4* 3.7   Recent Labs  Lab 11/29/19 1507 12/01/19 1332 12/02/19 0623  LIPASE 32 26 59*   No results for input(s): AMMONIA in the last 168 hours. Coagulation Profile: No results for input(s): INR, PROTIME in the last 168 hours. Cardiac Enzymes: No results for input(s): CKTOTAL, CKMB, CKMBINDEX, TROPONINI in the last 168 hours. BNP (last 3 results) No results for input(s): PROBNP in the last 8760 hours. HbA1C: No results for input(s): HGBA1C in the last 72 hours. CBG: No results for input(s): GLUCAP in the last 168 hours. Lipid Profile: No results for input(s): CHOL, HDL, LDLCALC, TRIG, CHOLHDL, LDLDIRECT in the last 72 hours. Thyroid Function Tests: Recent Labs    12/02/19 0623  TSH 0.454   Anemia Panel: No results for input(s): VITAMINB12, FOLATE, FERRITIN, TIBC, IRON, RETICCTPCT in the last 72 hours.  Sepsis Labs: No results for input(s): PROCALCITON, LATICACIDVEN in the last 168 hours.  Recent Results (from the past 240 hour(s))  SARS Coronavirus 2 by RT PCR (hospital order, performed in [facility] hospital lab) Nasopharyngeal Nasopharyngeal Swab     Status: None   Collection Time: 12/02/19  1:42 PM   Specimen: Nasopharyngeal Swab  Result Value Ref Range Status   SARS Coronavirus 2 NEGATIVE NEGATIVE Final    Comment: (NOTE) SARS-CoV-2 target nucleic acids are NOT DETECTED. The SARS-CoV-2 RNA is generally detectable in upper and lower respiratory specimens during the acute phase of infection. The lowest concentration of SARS-CoV-2 viral copies this assay can detect is 250 copies /  mL. A negative result does not preclude SARS-CoV-2 infection and should not be used as the sole basis for treatment or other patient management decisions.  A negative result may occur with improper specimen collection / handling, submission of specimen other than nasopharyngeal swab, presence of viral mutation(s) within the areas targeted by this assay, and inadequate number of viral copies (<250 copies / mL). A negative result must be combined with clinical observations, patient history, and epidemiological information. Fact Sheet for Patients:   BoilerBrush.com.cy Fact Sheet for Healthcare Providers: https://pope.com/ This test is not yet approved or cleared  by the [place] FDA and has been authorized for detection and/or diagnosis of SARS-CoV-2 by FDA under an Emergency Use Authorization (EUA).  This EUA will remain in effect (meaning this test can be used) for the duration of the COVID-19 declaration under Section 564(b)(1) of the Act, 21 U.S.C. section 360bbb-3(b)(1), unless the authorization is terminated or revoked sooner. Performed at [facility], [address], [place], [place] [address]   Gastrointestinal Panel by  PCR , Stool     Status: None   Collection Time: 12/03/19  8:44 AM   Specimen: Anus; Stool  Result Value Ref Range Status   Campylobacter species NOT DETECTED NOT DETECTED Final   Plesimonas shigelloides NOT DETECTED NOT DETECTED Final   Salmonella species NOT DETECTED NOT DETECTED Final   Yersinia enterocolitica NOT DETECTED NOT DETECTED Final   Vibrio species NOT DETECTED NOT DETECTED Final   Vibrio cholerae NOT DETECTED NOT DETECTED Final   Enteroaggregative E coli (EAEC) NOT DETECTED NOT DETECTED Final   Enteropathogenic E coli (EPEC) NOT DETECTED NOT DETECTED Final   Enterotoxigenic E coli (ETEC) NOT DETECTED NOT DETECTED Final   Shiga like toxin producing E coli (STEC) NOT DETECTED NOT DETECTED Final   Shigella/Enteroinvasive E coli (EIEC) NOT DETECTED NOT DETECTED Final   Cryptosporidium NOT DETECTED NOT DETECTED Final   Cyclospora cayetanensis NOT DETECTED NOT DETECTED Final   Entamoeba histolytica NOT DETECTED NOT DETECTED Final   Giardia lamblia NOT DETECTED NOT DETECTED Final   Adenovirus F40/41 NOT DETECTED NOT DETECTED Final   Astrovirus NOT DETECTED NOT DETECTED Final   Norovirus GI/GII NOT DETECTED NOT DETECTED Final   Rotavirus A NOT DETECTED NOT DETECTED Final   Sapovirus (I, II, IV, and V) NOT DETECTED NOT DETECTED Final    Comment: Performed at [facility], [address] [place], [place] [address]  C Difficile Quick Screen w PCR reflex     Status: None   Collection Time: 12/03/19  8:44 AM   Specimen: STOOL  Result Value Ref Range Status   C Diff antigen NEGATIVE NEGATIVE Final   C Diff toxin NEGATIVE NEGATIVE Final   C Diff interpretation No C. difficile detected.  Final    Comment: Performed at [facility], [address] [place], [place] [address]      Radiology Studies: DG Abd 1 View  Result Date: 12/02/2019 CLINICAL DATA:  Abdominal pain EXAM: ABDOMEN - 1 VIEW COMPARISON:  11/29/2019 CT FINDINGS: The bowel gas pattern is  normal. No radio-opaque calculi or other significant radiographic abnormality are seen. IMPRESSION: Negative. Electronically Signed   By: Charlett Nose M.D.   On: 12/02/2019 17:14   US Abdomen Complete  Result Date: 12/02/2019 CLINICAL DATA:  Abdominal pain with nausea and vomiting x3 weeks. EXAM: ABDOMEN ULTRASOUND COMPLETE COMPARISON:  None. FINDINGS: Gallbladder: No gallstones or wall thickening visualized (2.0 mm). No sonographic Murphy sign  noted by sonographer. Common bile duct: Diameter: 4.2 mm Liver: A 4.7 cm x 2.3 cm x 2.4 cm area of heterogeneous increased echogenicity is seen within the right lobe of the liver. Within normal limits in parenchymal echogenicity. Portal vein is patent on color Doppler imaging with normal direction of blood flow towards the liver. IVC: No abnormality visualized. Pancreas: Visualized portion unremarkable. Spleen: Size (5.8 cm) and appearance within normal limits. Right Kidney: Length: 12.1 cm. Echogenicity within normal limits. No mass or hydronephrosis visualized. Left Kidney: Length: 11.7 cm. Echogenicity within normal limits. No mass or hydronephrosis visualized. Abdominal aorta: No aneurysm visualized. Other findings: None. IMPRESSION: 1. Area of heterogeneous increased echogenicity within the right lobe of the liver which may represent an area of focal fatty infiltration. 2. No evidence of cholelithiasis or acute cholecystitis. Electronically Signed   By: Virgina Norfolk M.D.   On: 12/02/2019 17:54     LOS: 2 days   Antonieta Pert, MD [facility]  12/04/2019, 10:16 AM

## 2019-12-05 LAB — COMPREHENSIVE METABOLIC PANEL
ALT: 31 U/L (ref 0–44)
AST: 20 U/L (ref 15–41)
Albumin: 4 g/dL (ref 3.5–5.0)
Alkaline Phosphatase: 47 U/L (ref 38–126)
Anion gap: 8 (ref 5–15)
BUN: 6 mg/dL (ref 6–20)
CO2: 26 mmol/L (ref 22–32)
Calcium: 8.9 mg/dL (ref 8.9–10.3)
Chloride: 104 mmol/L (ref 98–111)
Creatinine, Ser: 1.04 mg/dL (ref 0.61–1.24)
GFR calc Af Amer: >60 mL/min (ref >60)
GFR calc non Af Amer: >60 mL/min (ref >60)
Glucose, Bld: 109 mg/dL — ABNORMAL HIGH (ref 70–99)
Potassium: 3.2 mmol/L — ABNORMAL LOW (ref 3.5–5.1)
Sodium: 138 mmol/L (ref 135–145)
Total Bilirubin: 0.6 mg/dL (ref 0.3–1.2)
Total Protein: 6.9 g/dL (ref 6.5–8.1)

## 2019-12-05 MED ORDER — OMEPRAZOLE 20 MG PO CPDR: 20.0000 mg | DELAYED_RELEASE_CAPSULE | Freq: Every day | ORAL | 11 refills | Status: AC

## 2019-12-05 MED ORDER — POTASSIUM CHLORIDE 20 MEQ/15ML (10%) PO SOLN
40.0000 meq | Freq: Once | ORAL | Status: DC
  Filled 2019-12-05: qty 30

## 2019-12-05 MED ORDER — ALUM & MAG HYDROXIDE-SIMETH 200-200-20 MG/5ML PO SUSP
30.0000 mL | ORAL | Status: DC | PRN
  Administered 2019-12-05: 30 mL via ORAL
  Filled 2019-12-05: qty 30

## 2019-12-05 MED ORDER — POTASSIUM CHLORIDE 10 MEQ/100ML IV SOLN
10.0000 meq | INTRAVENOUS | Status: AC
  Administered 2019-12-05 (×3): 10 meq via INTRAVENOUS
  Filled 2019-12-05 (×3): qty 100

## 2019-12-05 MED ORDER — ONDANSETRON HCL 4 MG PO TABS: 4.0000 mg | ORAL_TABLET | Freq: Three times a day (TID) | ORAL | 0 refills | Status: AC | PRN

## 2019-12-05 NOTE — Discharge Summary (Signed)
Physician Discharge Summary  Andre Gibbs TFT:[id number] DOB: Aug 09, 1985 DOA: 12/02/2019  PCP: Jac Canavan, PA-C  Admit date: 12/02/2019 Discharge date: 12/05/2019  Admitted From: home Disposition:  home  Recommendations for Outpatient Follow-up:  1. Follow up with PCP in 1-2 weeks 2. Please obtain BMP/CBC in one week 3. Please follow up on the following pending results:  Home Health:no  Equipment/Devices: none  Discharge Condition: Stable Code Status: Full Diet recommendation:  Diet Order            Diet - low sodium heart healthy        DIET SOFT Room service appropriate? Yes; Fluid consistency: Thin  Diet effective now               Brief/Interim Summary: 34 year old man with history of anxiety, alcohol abuse, drug use with marijuana, recently discharged on 5/8 after being treated for intractable nausea vomiting colitis drug abuse presented 5/18 with intractable nausea vomiting and diarrhea.  He has had multiple bowel episodes of nonbilious emesis occasionally streaked with blood and nonbloody diarrhea and cramping abdominal pain.  Patient had CT scan of 5/18 that was negative for any acute finding. Patient was admitted hydrated managed conservatively starting with clear liquid diet and slowly advance.  He had setbacks did not tolerate full liquid diet again we went back on clear liquid diet and started diet advance.  This time patient is able to tolerate soft diet.  Is medically stable discharge. Is wanting to go home today.  Discharge Diagnoses:  Assessment & Plan:  Intractable nausea and vomiting secondary to cannabinoid hyperemesis syndrome. Now overall improved.  Tolerating diet.Will send home on Zofran PPI.GI panel pending and C. difficile was back and negative.  Hypokalemia replaced iv kcl  Anxiety continue Ativan.Mood is stable.  Hypertension blood pressure controlled on metoprolol.  Tobacco use-advise cessation  Drug abuse:We discussed about  cannabinoid cessation, verbalized understanding Patient tolerated diet well and was adamant on going home and he was discharged in medically stable condition. Consults:  none  Subjective: Doing well, tolerating.  Discharge Exam: Vitals:   12/05/19 0612 12/05/19 1325  BP: (!) 147/102 (!) 141/86  Pulse: 77 85  Resp: 18 18  Temp: 98.4 F (36.9 C) 98.6 F (37 C)  SpO2: 100% 99%   General: Pt is alert, awake, not in acute distress Cardiovascular: RRR, S1/S2 +, no rubs, no gallops Respiratory: CTA bilaterally, no wheezing, no rhonchi Abdominal: Soft, NT, ND, bowel sounds + Extremities: no edema, no cyanosis  Discharge Instructions  Discharge Instructions    Diet - low sodium heart healthy   Complete by: As directed    Discharge instructions   Complete by: As directed    Follow-up with your PCP in 1 week.  Avoid using cannabis or any other illicit substance.  Please call call MD or return to ER for similar or worsening recurring problem that brought you to hospital or if any fever,nausea/vomiting,abdominal pain, uncontrolled pain, chest pain,  shortness of breath or any other alarming symptoms.  Please follow-up your doctor as instructed in a week time and call the office for appointment.  Please avoid alcohol, smoking, or any other illicit substance and maintain healthy habits including taking your regular medications as prescribed.  You were cared for by a hospitalist during your hospital stay. If you have any questions about your discharge medications or the care you received while you were in the hospital after you are discharged, you can call the unit and ask to  speak with the hospitalist on call if the hospitalist that took care of you is not available.  Once you are discharged, your primary care physician will handle any further medical issues. Please note that NO REFILLS for any discharge medications will be authorized once you are discharged, as it is imperative that  you return to your primary care physician (or establish a relationship with a primary care physician if you do not have one) for your aftercare needs so that they can reassess your need for medications and monitor your lab values   Increase activity slowly   Complete by: As directed      Allergies as of 12/05/2019   No Known Allergies     Medication List    TAKE these medications   acetaminophen 500 MG tablet Commonly known as: TYLENOL Take 500 mg by mouth every 6 (six) hours as needed for moderate pain.   omeprazole 20 MG capsule Commonly known as: PriLOSEC Take 1 capsule (20 mg total) by mouth daily.   ondansetron 4 MG tablet Commonly known as: Zofran Take 1 tablet (4 mg total) by mouth every 8 (eight) hours as needed for up to 30 doses for nausea or vomiting.   promethazine 25 MG tablet Commonly known as: PHENERGAN Take 1 tablet (25 mg total) by mouth every 8 (eight) hours as needed for nausea or vomiting.      Follow-up Information    Tysinger, Kermit Balo, PA-C Follow up in 1 week(s).   Specialty: Family Medicine Contact information: [address] [place] [address] [phone]          No Known Allergies  The results of significant diagnostics from this hospitalization (including imaging, microbiology, ancillary and laboratory) are listed below for reference.    Microbiology: Recent Results (from the past 240 hour(s))  SARS Coronavirus 2 by RT PCR (hospital order, performed in [facility] hospital lab) Nasopharyngeal Nasopharyngeal Swab     Status: None   Collection Time: 12/02/19  1:42 PM   Specimen: Nasopharyngeal Swab  Result Value Ref Range Status   SARS Coronavirus 2 NEGATIVE NEGATIVE Final    Comment: (NOTE) SARS-CoV-2 target nucleic acids are NOT DETECTED. The SARS-CoV-2 RNA is generally detectable in upper and lower respiratory specimens during the acute phase of infection. The lowest concentration of SARS-CoV-2 viral copies this assay can  detect is 250 copies / mL. A negative result does not preclude SARS-CoV-2 infection and should not be used as the sole basis for treatment or other patient management decisions.  A negative result may occur with improper specimen collection / handling, submission of specimen other than nasopharyngeal swab, presence of viral mutation(s) within the areas targeted by this assay, and inadequate number of viral copies (<250 copies / mL). A negative result must be combined with clinical observations, patient history, and epidemiological information. Fact Sheet for Patients:   BoilerBrush.com.cy Fact Sheet for Healthcare Providers: https://pope.com/ This test is not yet approved or cleared  by the [place] FDA and has been authorized for detection and/or diagnosis of SARS-CoV-2 by FDA under an Emergency Use Authorization (EUA).  This EUA will remain in effect (meaning this test can be used) for the duration of the COVID-19 declaration under Section 564(b)(1) of the Act, 21 U.S.C. section 360bbb-3(b)(1), unless the authorization is terminated or revoked sooner. Performed at [facility], [address] [place], [place] [address]   Gastrointestinal Panel by PCR , Stool     Status: None   Collection  Time: 12/03/19  8:44 AM   Specimen: Anus; Stool  Result Value Ref Range Status   Campylobacter species NOT DETECTED NOT DETECTED Final   Plesimonas shigelloides NOT DETECTED NOT DETECTED Final   Salmonella species NOT DETECTED NOT DETECTED Final   Yersinia enterocolitica NOT DETECTED NOT DETECTED Final   Vibrio species NOT DETECTED NOT DETECTED Final   Vibrio cholerae NOT DETECTED NOT DETECTED Final   Enteroaggregative E coli (EAEC) NOT DETECTED NOT DETECTED Final   Enteropathogenic E coli (EPEC) NOT DETECTED NOT DETECTED Final   Enterotoxigenic E coli (ETEC) NOT DETECTED NOT DETECTED Final   Shiga like toxin producing E  coli (STEC) NOT DETECTED NOT DETECTED Final   Shigella/Enteroinvasive E coli (EIEC) NOT DETECTED NOT DETECTED Final   Cryptosporidium NOT DETECTED NOT DETECTED Final   Cyclospora cayetanensis NOT DETECTED NOT DETECTED Final   Entamoeba histolytica NOT DETECTED NOT DETECTED Final   Giardia lamblia NOT DETECTED NOT DETECTED Final   Adenovirus F40/41 NOT DETECTED NOT DETECTED Final   Astrovirus NOT DETECTED NOT DETECTED Final   Norovirus GI/GII NOT DETECTED NOT DETECTED Final   Rotavirus A NOT DETECTED NOT DETECTED Final   Sapovirus (I, II, IV, and V) NOT DETECTED NOT DETECTED Final    Comment: Performed at [facility], [address] [place], [place] [address]  C Difficile Quick Screen w PCR reflex     Status: None   Collection Time: 12/03/19  8:44 AM   Specimen: STOOL  Result Value Ref Range Status   C Diff antigen NEGATIVE NEGATIVE Final   C Diff toxin NEGATIVE NEGATIVE Final   C Diff interpretation No C. difficile detected.  Final    Comment: Performed at [facility], [place] [address], [place], [place] [address]    Procedures/Studies: DG Abd 1 View  Result Date: 12/02/2019 CLINICAL DATA:  Abdominal pain EXAM: ABDOMEN - 1 VIEW COMPARISON:  11/29/2019 CT FINDINGS: The bowel gas pattern is normal. No radio-opaque calculi or other significant radiographic abnormality are seen. IMPRESSION: Negative. Electronically Signed   By: Rolm Baptise M.D.   On: 12/02/2019 17:14   US Abdomen Complete  Result Date: 12/02/2019 CLINICAL DATA:  Abdominal pain with nausea and vomiting x3 weeks. EXAM: ABDOMEN ULTRASOUND COMPLETE COMPARISON:  None. FINDINGS: Gallbladder: No gallstones or wall thickening visualized (2.0 mm). No sonographic Murphy sign noted by sonographer. Common bile duct: Diameter: 4.2 mm Liver: A 4.7 cm x 2.3 cm x 2.4 cm area of heterogeneous increased echogenicity is seen within the right lobe of the liver. Within normal limits in parenchymal echogenicity.  Portal vein is patent on color Doppler imaging with normal direction of blood flow towards the liver. IVC: No abnormality visualized. Pancreas: Visualized portion unremarkable. Spleen: Size (5.8 cm) and appearance within normal limits. Right Kidney: Length: 12.1 cm. Echogenicity within normal limits. No mass or hydronephrosis visualized. Left Kidney: Length: 11.7 cm. Echogenicity within normal limits. No mass or hydronephrosis visualized. Abdominal aorta: No aneurysm visualized. Other findings: None. IMPRESSION: 1. Area of heterogeneous increased echogenicity within the right lobe of the liver which may represent an area of focal fatty infiltration. 2. No evidence of cholelithiasis or acute cholecystitis. Electronically Signed   By: Virgina Norfolk M.D.   On: 12/02/2019 17:54   CT ABDOMEN PELVIS W CONTRAST  Result Date: 11/29/2019 CLINICAL DATA:  Recent history of colitis, abdominal pain vomiting 4 days ago EXAM: CT ABDOMEN AND PELVIS WITH CONTRAST TECHNIQUE: Multidetector CT imaging of the abdomen and pelvis was  performed using the standard protocol following bolus administration of intravenous contrast. CONTRAST:  OMNIPAQUE IOHEXOL 300 MG/ML  SOLN COMPARISON:  None. FINDINGS: Lower chest: The visualized heart size within normal limits. No pericardial fluid/thickening. No hiatal hernia. The visualized portions of the lungs are clear. Hepatobiliary: The liver is normal in density without focal abnormality.The main portal vein is patent. No evidence of calcified gallstones, gallbladder wall thickening or biliary dilatation. Pancreas: Unremarkable. No pancreatic ductal dilatation or surrounding inflammatory changes. Spleen: Normal in size without focal abnormality. Adrenals/Urinary Tract: Both adrenal glands appear normal. The kidneys and collecting system appear normal without evidence of urinary tract calculus or hydronephrosis. Bladder is unremarkable. Stomach/Bowel: The stomach, small bowel, and colon  are normal in appearance. No inflammatory changes, wall thickening, or obstructive findings.The appendix is normal. Vascular/Lymphatic: There are no enlarged mesenteric, retroperitoneal, or pelvic lymph nodes. No significant vascular findings are present. Reproductive: The prostate is unremarkable. Other: Tiny fat containing anterior umbilical hernia noted. Musculoskeletal: No acute or significant osseous findings. IMPRESSION: No acute intra-abdominal or pelvic pathology to explain the patient's symptoms. Electronically Signed   By: Jonna Clark M.D.   On: 11/29/2019 19:11   CT Abdomen Pelvis W Contrast  Result Date: 11/18/2019 CLINICAL DATA:  Diverticulitis suspected, vomiting and left lower quadrant abdominal pain, denies fevers EXAM: CT ABDOMEN AND PELVIS WITH CONTRAST TECHNIQUE: Multidetector CT imaging of the abdomen and pelvis was performed using the standard protocol following bolus administration of intravenous contrast. CONTRAST:  OMNIPAQUE IOHEXOL 300 MG/ML  SOLN COMPARISON:  None FINDINGS: Lower chest: Lung bases are clear. Normal heart size. No pericardial effusion. Hepatobiliary: No focal liver abnormality is seen. No gallstones, gallbladder wall thickening, or biliary dilatation. Pancreas: Unremarkable. No pancreatic ductal dilatation or surrounding inflammatory changes. Spleen: Normal in size without focal abnormality. Small accessory splenule. Adrenals/Urinary Tract: Normal adrenal glands. Kidneys are normally located with symmetric enhancement with mild early excretion. No suspicious renal lesion, urolithiasis or hydronephrosis. Stomach/Bowel: Distal esophagus, stomach and duodenal sweep are unremarkable. No small bowel wall thickening or dilatation. No evidence of obstruction. A normal appendix is visualized. Proximal colon is unremarkable. There is some mild segmental thickening of the distal descending and sigmoid colon with faint Peri colonic haze. Few scattered colonic diverticula  without focal inflammation to suggest acute diverticulitis. Vascular/Lymphatic: The aorta is normal caliber. No suspicious or enlarged lymph nodes in the included lymphatic chains. Reproductive: The prostate and seminal vesicles are unremarkable. Other: No abdominopelvic free fluid or free gas. No bowel containing hernias. Musculoskeletal: No acute osseous abnormality or suspicious osseous lesion. IMPRESSION: 1. Mild segmental thickening of the distal descending and sigmoid colon with faint pericolonic haze may represent a mild colitis of infectious or inflammatory etiology. 2. Few scattered colonic diverticula without focal inflammation to suggest acute diverticulitis. Electronically Signed   By: Kreg Shropshire M.D.   On: 11/18/2019 06:24    Labs: BNP (last 3 results) No results for input(s): BNP in the last 8760 hours. Basic Metabolic Panel: Recent Labs  Lab 12/01/19 1332 12/02/19 0623 12/03/19 0446 12/04/19 0511 12/05/19 0516  NA 135 138 137 136 138  K 3.4* 3.2* 3.6 3.3* 3.2*  CL 97* 102 106 102 104  CO2 26 24 25 23 26   GLUCOSE 104* 98 100* 107* 109*  BUN 10 11 8 6 6   CREATININE 0.97 1.05 0.83 0.81 1.04  CALCIUM 8.9 9.4 8.3* 8.5* 8.9  MG  --  2.3 1.9 2.0  --   PHOS  --  2.7 4.1 3.9  --    Liver Function Tests: Recent Labs  Lab 12/01/19 1332 12/02/19 0623 12/03/19 0446 12/04/19 0511 12/05/19 0516  AST 21 27 18 17 20   ALT 41 44 32 30 31  ALKPHOS 46 51 42 45 47  BILITOT 0.7 0.7 0.6 0.8 0.6  PROT 7.2 8.0 6.0* 6.4* 6.9  ALBUMIN 4.0 4.5 3.4* 3.7 4.0   Recent Labs  Lab 11/29/19 1507 12/01/19 1332 12/02/19 0623  LIPASE 32 26 59*   No results for input(s): AMMONIA in the last 168 hours. CBC: Recent Labs  Lab 11/29/19 1507 12/01/19 1332 12/02/19 0623 12/03/19 0446 12/04/19 0511  WBC 10.2 6.5 7.6 6.4 8.3  NEUTROABS 6.8 3.6  --  3.1 4.8  HGB 16.8 14.3 15.2 12.9* 13.0  HCT 46.4 40.6 44.7 38.4* 38.0*  MCV 85.0 85.5 89.0 89.5 88.2  PLT 361 317 333 283 321   Cardiac  Enzymes: No results for input(s): CKTOTAL, CKMB, CKMBINDEX, TROPONINI in the last 168 hours. BNP: Invalid input(s): POCBNP CBG: No results for input(s): GLUCAP in the last 168 hours. D-Dimer No results for input(s): DDIMER in the last 72 hours. Hgb A1c No results for input(s): HGBA1C in the last 72 hours. Lipid Profile No results for input(s): CHOL, HDL, LDLCALC, TRIG, CHOLHDL, LDLDIRECT in the last 72 hours. Thyroid function studies No results for input(s): TSH, T4TOTAL, T3FREE, THYROIDAB in the last 72 hours.  Invalid input(s): FREET3 Anemia work up No results for input(s): VITAMINB12, FOLATE, FERRITIN, TIBC, IRON, RETICCTPCT in the last 72 hours. Urinalysis    Component Value Date/Time   COLORURINE YELLOW 12/01/2019 1525   APPEARANCEUR CLOUDY (A) 12/01/2019 1525   LABSPEC 1.015 12/01/2019 1525   LABSPEC 1.020 03/29/2017 1435   PHURINE 7.5 12/01/2019 1525   GLUCOSEU NEGATIVE 12/01/2019 1525   HGBUR NEGATIVE 12/01/2019 1525   BILIRUBINUR NEGATIVE 12/01/2019 1525   BILIRUBINUR negative 03/29/2017 1435   KETONESUR 15 (A) 12/01/2019 1525   PROTEINUR NEGATIVE 12/01/2019 1525   NITRITE NEGATIVE 12/01/2019 1525   LEUKOCYTESUR NEGATIVE 12/01/2019 1525   Sepsis Labs Invalid input(s): PROCALCITONIN,  WBC,  LACTICIDVEN Microbiology Recent Results (from the past 240 hour(s))  SARS Coronavirus 2 by RT PCR (hospital order, performed in [facility] lab) Nasopharyngeal Nasopharyngeal Swab     Status: None   Collection Time: 12/02/19  1:42 PM   Specimen: Nasopharyngeal Swab  Result Value Ref Range Status   SARS Coronavirus 2 NEGATIVE NEGATIVE Final    Comment: (NOTE) SARS-CoV-2 target nucleic acids are NOT DETECTED. The SARS-CoV-2 RNA is generally detectable in upper and lower respiratory specimens during the acute phase of infection. The lowest concentration of SARS-CoV-2 viral copies this assay can detect is 250 copies / mL. A negative result does not preclude SARS-CoV-2  infection and should not be used as the sole basis for treatment or other patient management decisions.  A negative result may occur with improper specimen collection / handling, submission of specimen other than nasopharyngeal swab, presence of viral mutation(s) within the areas targeted by this assay, and inadequate number of viral copies (<250 copies / mL). A negative result must be combined with clinical observations, patient history, and epidemiological information. Fact Sheet for Patients:   BoilerBrush.com.cyhttps://www.fda.gov/media/136312/download Fact Sheet for Healthcare Providers: https://pope.com/https://www.fda.gov/media/136313/download This test is not yet approved or cleared  by the [place] FDA and has been authorized for detection and/or diagnosis of SARS-CoV-2 by FDA under an Emergency Use Authorization (EUA).  This EUA will remain in effect (  meaning this test can be used) for the duration of the COVID-19 declaration under Section 564(b)(1) of the Act, 21 U.S.C. section 360bbb-3(b)(1), unless the authorization is terminated or revoked sooner. Performed at [facility], [address] [place], [place] [address]   Gastrointestinal Panel by PCR , Stool     Status: None   Collection Time: 12/03/19  8:44 AM   Specimen: Anus; Stool  Result Value Ref Range Status   Campylobacter species NOT DETECTED NOT DETECTED Final   Plesimonas shigelloides NOT DETECTED NOT DETECTED Final   Salmonella species NOT DETECTED NOT DETECTED Final   Yersinia enterocolitica NOT DETECTED NOT DETECTED Final   Vibrio species NOT DETECTED NOT DETECTED Final   Vibrio cholerae NOT DETECTED NOT DETECTED Final   Enteroaggregative E coli (EAEC) NOT DETECTED NOT DETECTED Final   Enteropathogenic E coli (EPEC) NOT DETECTED NOT DETECTED Final   Enterotoxigenic E coli (ETEC) NOT DETECTED NOT DETECTED Final   Shiga like toxin producing E coli (STEC) NOT DETECTED NOT DETECTED Final   Shigella/Enteroinvasive E coli  (EIEC) NOT DETECTED NOT DETECTED Final   Cryptosporidium NOT DETECTED NOT DETECTED Final   Cyclospora cayetanensis NOT DETECTED NOT DETECTED Final   Entamoeba histolytica NOT DETECTED NOT DETECTED Final   Giardia lamblia NOT DETECTED NOT DETECTED Final   Adenovirus F40/41 NOT DETECTED NOT DETECTED Final   Astrovirus NOT DETECTED NOT DETECTED Final   Norovirus GI/GII NOT DETECTED NOT DETECTED Final   Rotavirus A NOT DETECTED NOT DETECTED Final   Sapovirus (I, II, IV, and V) NOT DETECTED NOT DETECTED Final    Comment: Performed at [facility], [address] [place], [place] [address]  C Difficile Quick Screen w PCR reflex     Status: None   Collection Time: 12/03/19  8:44 AM   Specimen: STOOL  Result Value Ref Range Status   C Diff antigen NEGATIVE NEGATIVE Final   C Diff toxin NEGATIVE NEGATIVE Final   C Diff interpretation No C. difficile detected.  Final    Comment: Performed at [facility], [address], [place], [place] [address]     Time coordinating discharge: 25  minutes  SIGNED: Lanae Boast, MD  [facility] 12/05/2019, 3:39 PM  If 7PM-7AM, please contact night-coverage [email or web address]

## 2019-12-05 NOTE — Progress Notes (Deleted)
PROGRESS NOTE    Andre Gibbs  WNU:[id number] DOB: 1986-02-23 DOA: 12/02/2019 PCP: Carlena Hurl, PA-C   Chef Complaints: nausea/vomitting Brief Narrative: 34 year old man with history of anxiety, alcohol abuse, drug use with marijuana, recently discharged on 5/8 after being treated for intractable nausea vomiting colitis drug abuse presented 5/18 with intractable nausea vomiting and diarrhea.  He has had multiple bowel episodes of nonbilious emesis occasionally streaked with blood and nonbloody diarrhea and cramping abdominal pain.  Patient had CT scan of 5/18 that was negative for any acute finding.  Subjective:  Had FLD diet- did well but during night had gagging since 3.30 and still having gagging/hiccups and has not eaten breakfast  Assessment & Plan:  Intractable nausea and vomiting secondary to cannabinoid hyperemesis syndrome: On full liquid diet last night having a cup and nausea, remains n.p.o. continue IV fluid hydration symptomatic management,ppi.  GI panel pending and C. difficile was back and negative.  Hypokalemia will replete with IV potassium.  Unable to tolerate p.o.  Anxiety continue Ativan.  Mood is stable.  Hypertension blood pressure controlled on metoprolol.  Tobacco use- advise cessation  Drug abuse: We discussed about cannabinoid cessation, verbalized understanding  DVT prophylaxislovenox Code Status: full  Family Communication: plan of care discussed with patient at bedside. Status is: Inpatient  Remains inpatient appropriate because:IV treatments appropriate due to intensity of illness or inability to take PO and Inpatient level of care appropriate due to severity of illness   Dispo: The patient is from: Home              Anticipated d/c is to: Home              Anticipated d/c date is 2 days              Patient currently is not medically stable to d/c.  Diet Order            DIET SOFT Room service appropriate? Yes; Fluid consistency: Thin   Diet effective now              Body mass index is 31.89 kg/m.  Consultants:see note  Procedures:see note Microbiology:see note  Medications: Scheduled Meds: . metoprolol tartrate  5 mg Intravenous Q6H  . pantoprazole (PROTONIX) IV  40 mg Intravenous Q24H  . potassium chloride  40 mEq Oral Once   Continuous Infusions: . chlorproMAZINE (THORAZINE) IV    . chlorproMAZINE (THORAZINE) IV    . dextrose 5 % and 0.9% NaCl 100 mL/hr at 12/05/19 0253    Antimicrobials: Anti-infectives (From admission, onward)   None       Objective: Vitals: Today's Vitals   12/04/19 2130 12/04/19 2358 12/05/19 0612 12/05/19 0715  BP:  (!) 165/98 (!) 147/102   Pulse:   77   Resp:   18   Temp:   98.4 F (36.9 C)   TempSrc:   Oral   SpO2:   100%   Weight:      Height:      PainSc: 0-No pain   2     Intake/Output Summary (Last 24 hours) at 12/05/2019 0918 Last data filed at 12/04/2019 1501 Gross per 24 hour  Intake 600 ml  Output --  Net 600 ml   Filed Weights   12/02/19 0603  Weight: 118.8 kg   Weight change:    Intake/Output from previous day: 05/20 0701 - 05/21 0700 In: 600 [P.O.:600] Out: -  Intake/Output this shift: No intake/output data recorded.  Examination:  General exam: AAOx3 , NAD, weak appearing. HEENT:Oral mucosa moist, Ear/Nose WNL grossly, dentition normal. Respiratory system: bilaterally clear,no wheezing or crackles,no use of accessory muscle Cardiovascular system: S1 & S2 +, No JVD,. Gastrointestinal system: Abdomen soft, NT,ND, BS+ Nervous System:Alert, awake, moving extremities and grossly nonfocal Extremities: No edema, distal peripheral pulses palpable.  Skin: No rashes,no icterus. MSK: Normal muscle bulk,tone, power   Data Reviewed: I have personally reviewed following labs and imaging studies CBC: Recent Labs  Lab 11/29/19 1507 12/01/19 1332 12/02/19 0623 12/03/19 0446 12/04/19 0511  WBC 10.2 6.5 7.6 6.4 8.3  NEUTROABS 6.8 3.6  --   3.1 4.8  HGB 16.8 14.3 15.2 12.9* 13.0  HCT 46.4 40.6 44.7 38.4* 38.0*  MCV 85.0 85.5 89.0 89.5 88.2  PLT 361 317 333 283 321   Basic Metabolic Panel: Recent Labs  Lab 12/01/19 1332 12/02/19 0623 12/03/19 0446 12/04/19 0511 12/05/19 0516  NA 135 138 137 136 138  K 3.4* 3.2* 3.6 3.3* 3.2*  CL 97* 102 106 102 104  CO2 26 24 25 23 26   GLUCOSE 104* 98 100* 107* 109*  BUN 10 11 8 6 6   CREATININE 0.97 1.05 0.83 0.81 1.04  CALCIUM 8.9 9.4 8.3* 8.5* 8.9  MG  --  2.3 1.9 2.0  --   PHOS  --  2.7 4.1 3.9  --    GFR: Estimated Creatinine Clearance: 145 mL/min (by C-G formula based on SCr of 1.04 mg/dL). Liver Function Tests: Recent Labs  Lab 12/01/19 1332 12/02/19 0623 12/03/19 0446 12/04/19 0511 12/05/19 0516  AST 21 27 18 17 20   ALT 41 44 32 30 31  ALKPHOS 46 51 42 45 47  BILITOT 0.7 0.7 0.6 0.8 0.6  PROT 7.2 8.0 6.0* 6.4* 6.9  ALBUMIN 4.0 4.5 3.4* 3.7 4.0   Recent Labs  Lab 11/29/19 1507 12/01/19 1332 12/02/19 0623  LIPASE 32 26 59*   No results for input(s): AMMONIA in the last 168 hours. Coagulation Profile: No results for input(s): INR, PROTIME in the last 168 hours. Cardiac Enzymes: No results for input(s): CKTOTAL, CKMB, CKMBINDEX, TROPONINI in the last 168 hours. BNP (last 3 results) No results for input(s): PROBNP in the last 8760 hours. HbA1C: No results for input(s): HGBA1C in the last 72 hours. CBG: No results for input(s): GLUCAP in the last 168 hours. Lipid Profile: No results for input(s): CHOL, HDL, LDLCALC, TRIG, CHOLHDL, LDLDIRECT in the last 72 hours. Thyroid Function Tests: No results for input(s): TSH, T4TOTAL, FREET4, T3FREE, THYROIDAB in the last 72 hours. Anemia Panel: No results for input(s): VITAMINB12, FOLATE, FERRITIN, TIBC, IRON, RETICCTPCT in the last 72 hours. Sepsis Labs: No results for input(s): PROCALCITON, LATICACIDVEN in the last 168 hours.  Recent Results (from the past 240 hour(s))  SARS Coronavirus 2 by RT PCR (hospital  order, performed in [facility] hospital lab) Nasopharyngeal Nasopharyngeal Swab     Status: None   Collection Time: 12/02/19  1:42 PM   Specimen: Nasopharyngeal Swab  Result Value Ref Range Status   SARS Coronavirus 2 NEGATIVE NEGATIVE Final    Comment: (NOTE) SARS-CoV-2 target nucleic acids are NOT DETECTED. The SARS-CoV-2 RNA is generally detectable in upper and lower respiratory specimens during the acute phase of infection. The lowest concentration of SARS-CoV-2 viral copies this assay can detect is 250 copies / mL. A negative result does not preclude SARS-CoV-2 infection and should not be used as the sole basis for treatment or other patient management decisions.  A negative result may occur with improper specimen collection / handling, submission of specimen other than nasopharyngeal swab, presence of viral mutation(s) within the areas targeted by this assay, and inadequate number of viral copies (<250 copies / mL). A negative result must be combined with clinical observations, patient history, and epidemiological information. Fact Sheet for Patients:   BoilerBrush.com.cy Fact Sheet for Healthcare Providers: https://pope.com/ This test is not yet approved or cleared  by the [place] FDA and has been authorized for detection and/or diagnosis of SARS-CoV-2 by FDA under an Emergency Use Authorization (EUA).  This EUA will remain in effect (meaning this test can be used) for the duration of the COVID-19 declaration under Section 564(b)(1) of the Act, 21 U.S.C. section 360bbb-3(b)(1), unless the authorization is terminated or revoked sooner. Performed at [facility], [address], [place], [place] [address]   Gastrointestinal Panel by PCR , Stool     Status: None   Collection Time: 12/03/19  8:44 AM   Specimen: Anus; Stool  Result Value Ref Range Status   Campylobacter species NOT DETECTED NOT DETECTED  Final   Plesimonas shigelloides NOT DETECTED NOT DETECTED Final   Salmonella species NOT DETECTED NOT DETECTED Final   Yersinia enterocolitica NOT DETECTED NOT DETECTED Final   Vibrio species NOT DETECTED NOT DETECTED Final   Vibrio cholerae NOT DETECTED NOT DETECTED Final   Enteroaggregative E coli (EAEC) NOT DETECTED NOT DETECTED Final   Enteropathogenic E coli (EPEC) NOT DETECTED NOT DETECTED Final   Enterotoxigenic E coli (ETEC) NOT DETECTED NOT DETECTED Final   Shiga like toxin producing E coli (STEC) NOT DETECTED NOT DETECTED Final   Shigella/Enteroinvasive E coli (EIEC) NOT DETECTED NOT DETECTED Final   Cryptosporidium NOT DETECTED NOT DETECTED Final   Cyclospora cayetanensis NOT DETECTED NOT DETECTED Final   Entamoeba histolytica NOT DETECTED NOT DETECTED Final   Giardia lamblia NOT DETECTED NOT DETECTED Final   Adenovirus F40/41 NOT DETECTED NOT DETECTED Final   Astrovirus NOT DETECTED NOT DETECTED Final   Norovirus GI/GII NOT DETECTED NOT DETECTED Final   Rotavirus A NOT DETECTED NOT DETECTED Final   Sapovirus (I, II, IV, and V) NOT DETECTED NOT DETECTED Final    Comment: Performed at [facility], [address] [place], [place] [address]  C Difficile Quick Screen w PCR reflex     Status: None   Collection Time: 12/03/19  8:44 AM   Specimen: STOOL  Result Value Ref Range Status   C Diff antigen NEGATIVE NEGATIVE Final   C Diff toxin NEGATIVE NEGATIVE Final   C Diff interpretation No C. difficile detected.  Final    Comment: Performed at [facility], [address] [place], [place] [address]      Radiology Studies: No results found.   LOS: 3 days   Lanae Boast, MD [facility]  12/05/2019, 9:18 AM

## 2019-12-08 ENCOUNTER — Telehealth: Payer: Self-pay | Admitting: Medical

## 2019-12-08 NOTE — Telephone Encounter (Signed)
I called the pt. And went over his medications and reconciled them. He was already scheduled for a hospital f/u on 12/10/19.

## 2019-12-08 NOTE — Telephone Encounter (Signed)
Get in for hospital f/u

## 2019-12-10 ENCOUNTER — Inpatient Hospital Stay: Payer: Self-pay | Admitting: Medical

## 2019-12-17 ENCOUNTER — Encounter: Payer: Self-pay | Admitting: Family Medicine

## 2021-02-09 IMAGING — CT CT ABD-PELV W/ CM
2 of 4 series · 16 of 46 positions shown, 18 images · IV contrast (omnipaque)
Comparison: None

CLINICAL DATA: Diverticulitis suspected, vomiting and left lower
quadrant abdominal pain, denies fevers

EXAM:
CT ABDOMEN AND PELVIS WITH CONTRAST
TECHNIQUE: Multidetector CT imaging of the abdomen and pelvis was performed
using the standard protocol following bolus administration of
intravenous contrast.
CONTRAST:  100mL OMNIPAQUE IOHEXOL 300 MG/ML  SOLN

[Series 2: axial st · axial · 0.88mm/px · z∈[-563,-68]mm · 13 of 111 slices shown, 15 images]
[im 6/111  soft-tissue]
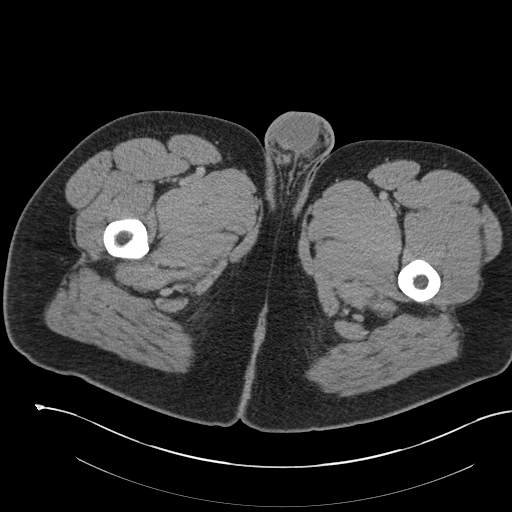
[im 6/111  bone]
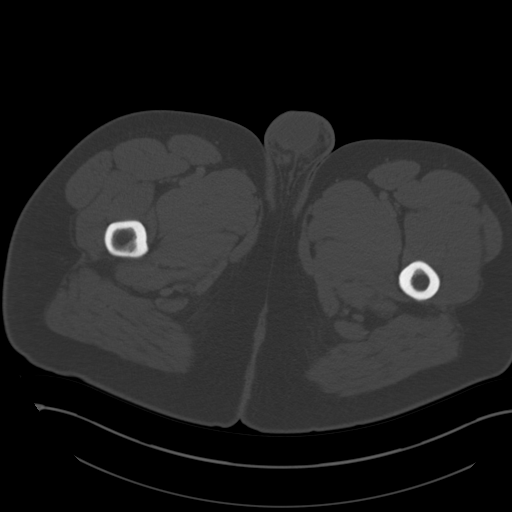
[im 18/111  soft-tissue]
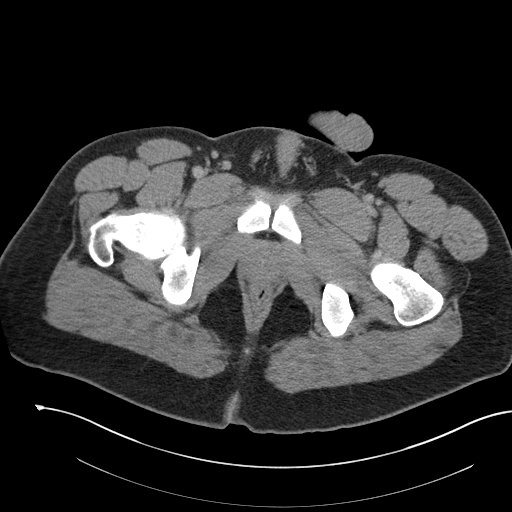
[im 24/111  soft-tissue]
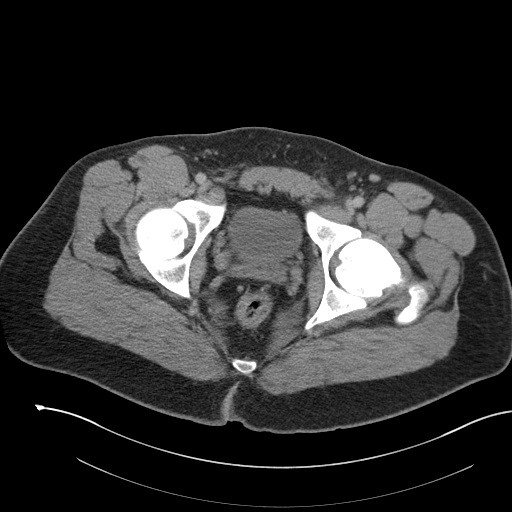
[im 29/111  soft-tissue]
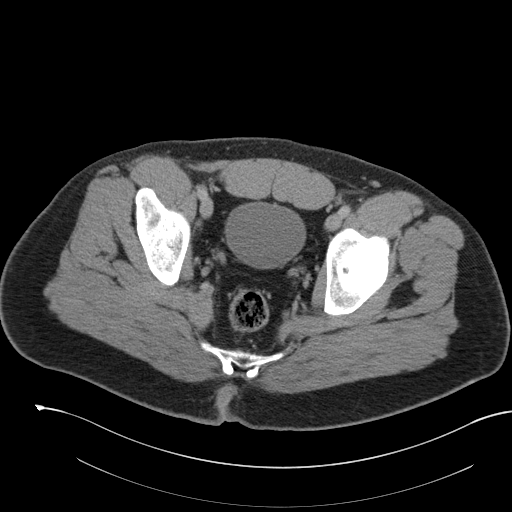
[im 41/111  soft-tissue]
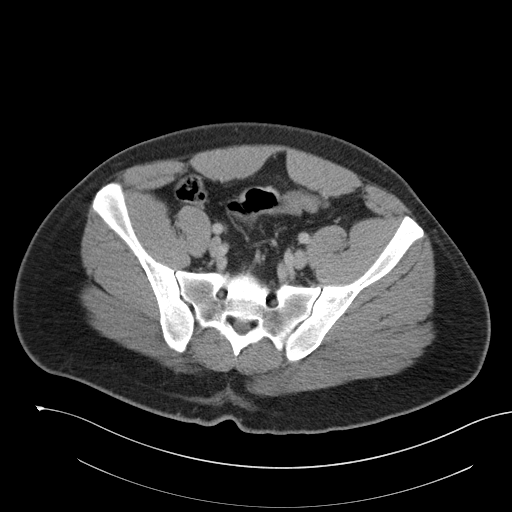
[im 47/111  soft-tissue]
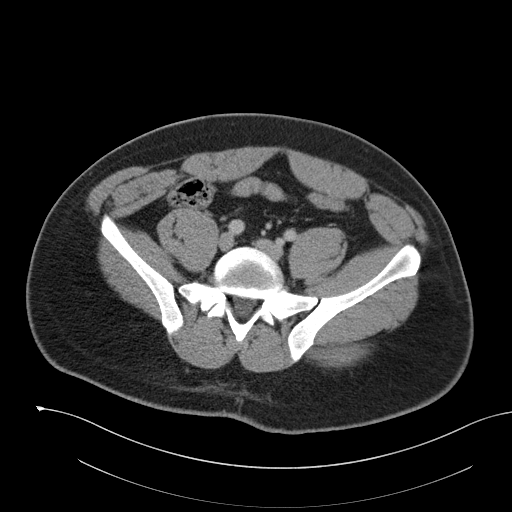
[im 58/111  soft-tissue]
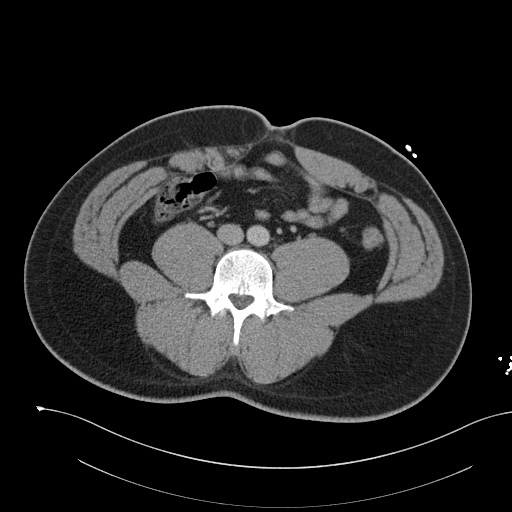
[im 64/111  soft-tissue]
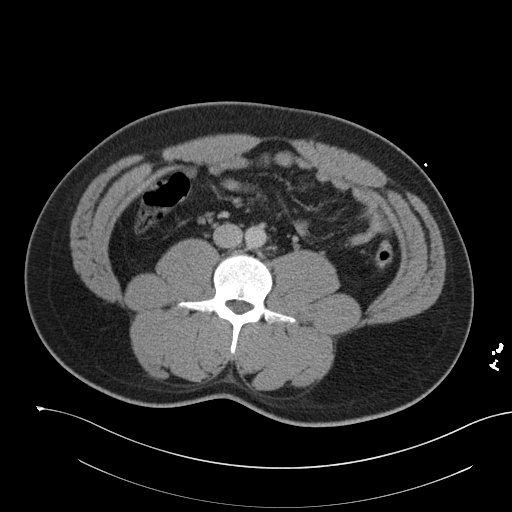
[im 70/111  soft-tissue]
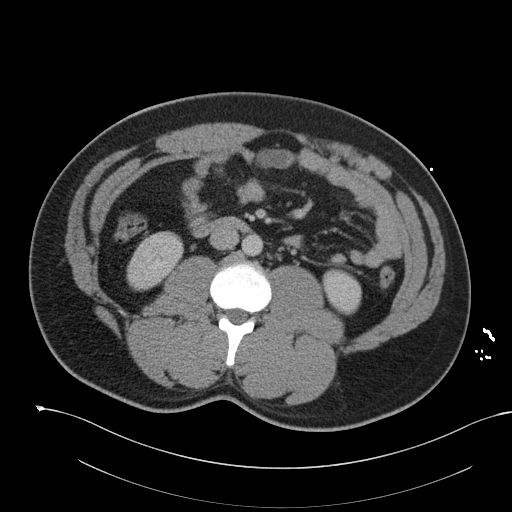
[im 70/111  bone]
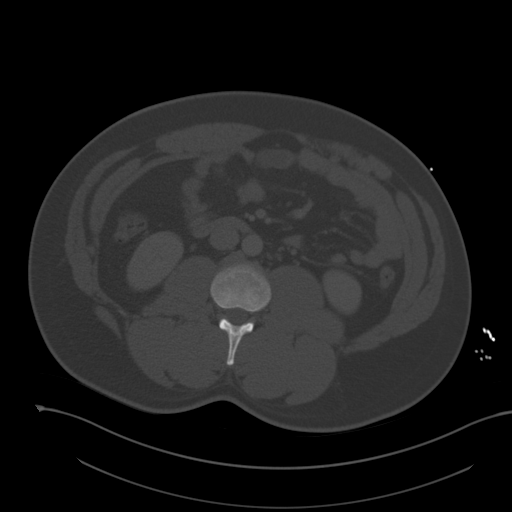
[im 82/111  soft-tissue]
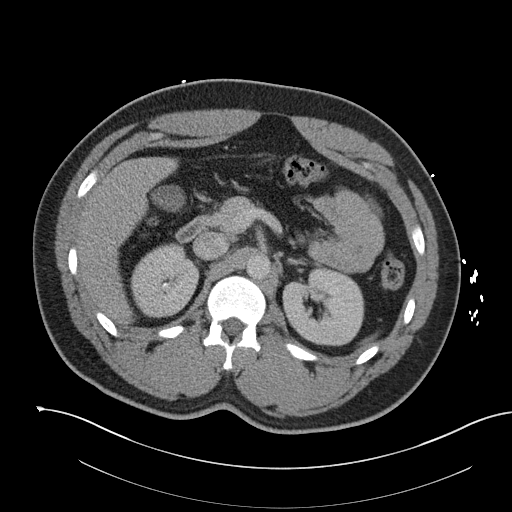
[im 87/111  soft-tissue]
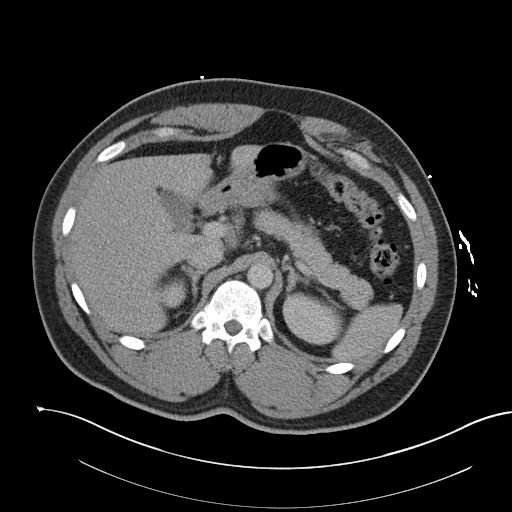
[im 93/111  soft-tissue]
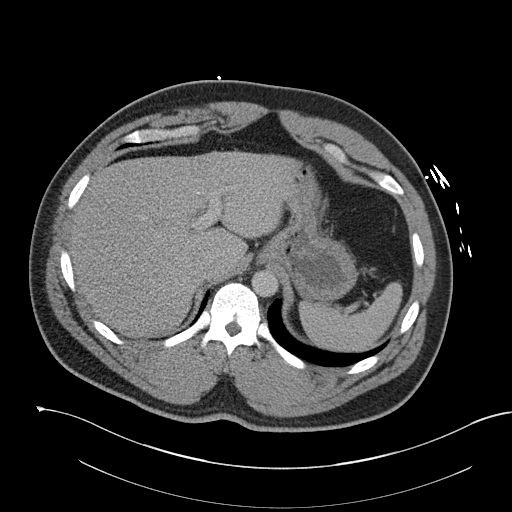
[im 105/111  soft-tissue]
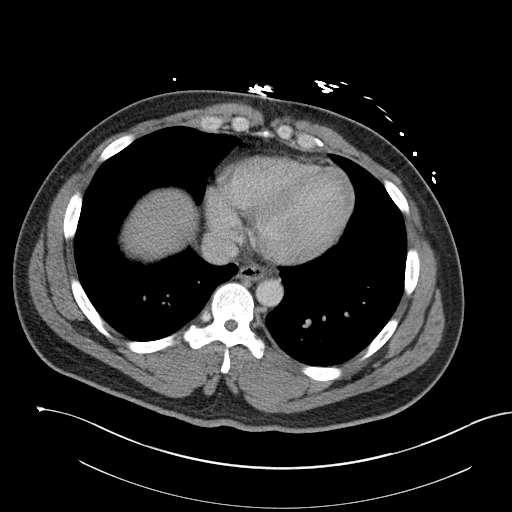

[Series 5: coronal st · coronal · 0.85mm/px · 3 of 156 slices shown]
[im 52/156  soft-tissue]
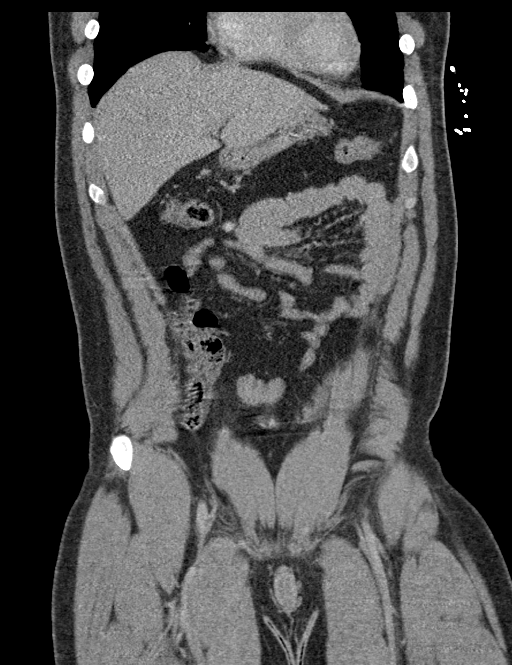
[im 69/156  soft-tissue]
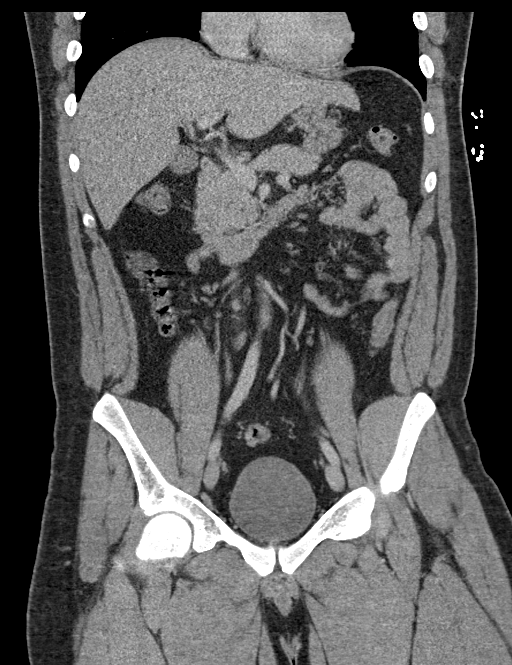
[im 87/156  soft-tissue]
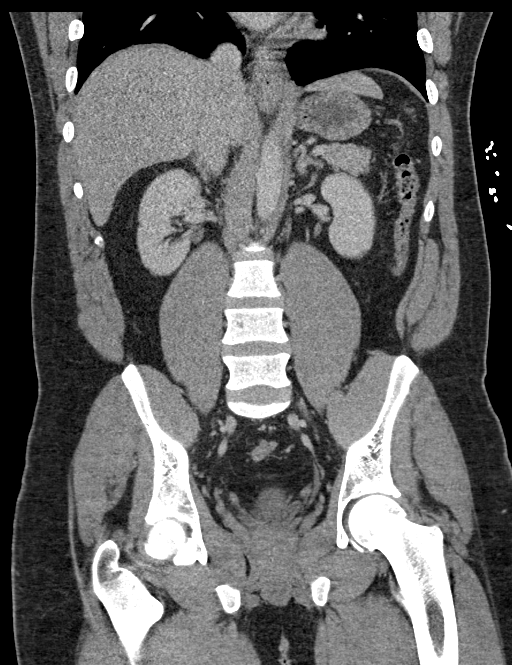

[16 of 46 positions shown; findings below may reference images not displayed]

FINDINGS: Lower chest: Lung bases are clear. Normal heart size. No pericardial
effusion.

Hepatobiliary: No focal liver abnormality is seen. No gallstones,
gallbladder wall thickening, or biliary dilatation.

Pancreas: Unremarkable. No pancreatic ductal dilatation or
surrounding inflammatory changes.

Spleen: Normal in size without focal abnormality. Small accessory
splenule.

Adrenals/Urinary Tract: Normal adrenal glands. Kidneys are normally
located with symmetric enhancement with mild early excretion. No
suspicious renal lesion, urolithiasis or hydronephrosis.

Stomach/Bowel: Distal esophagus, stomach and duodenal sweep are
unremarkable. No small bowel wall thickening or dilatation. No
evidence of obstruction. A normal appendix is visualized. Proximal
colon is unremarkable. There is some mild segmental thickening of
the distal descending and sigmoid colon with faint Peri colonic
haze. Few scattered colonic diverticula without focal inflammation
to suggest acute diverticulitis.

Vascular/Lymphatic: The aorta is normal caliber. No suspicious or
enlarged lymph nodes in the included lymphatic chains.

Reproductive: The prostate and seminal vesicles are unremarkable.

Other: No abdominopelvic free fluid or free gas. No bowel containing
hernias.

Musculoskeletal: No acute osseous abnormality or suspicious osseous
lesion.
IMPRESSION: 1. Mild segmental thickening of the distal descending and sigmoid
colon with faint pericolonic haze may represent a mild colitis of
infectious or inflammatory etiology.
2. Few scattered colonic diverticula without focal inflammation to
suggest acute diverticulitis.

## 2021-02-23 IMAGING — US US ABDOMEN COMPLETE
1 series · 13 of 25 positions shown · non-contrast
Comparison: None.

CLINICAL DATA: Abdominal pain with nausea and vomiting x3 weeks.

EXAM:
ABDOMEN ULTRASOUND COMPLETE

[Series 1: us abdomen complete · 13 of 123 slices shown]
[im 1/123]
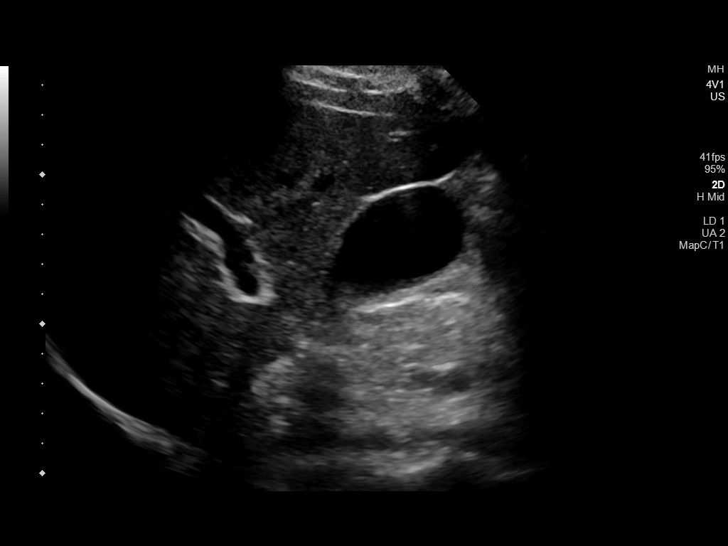
[im 11/123]
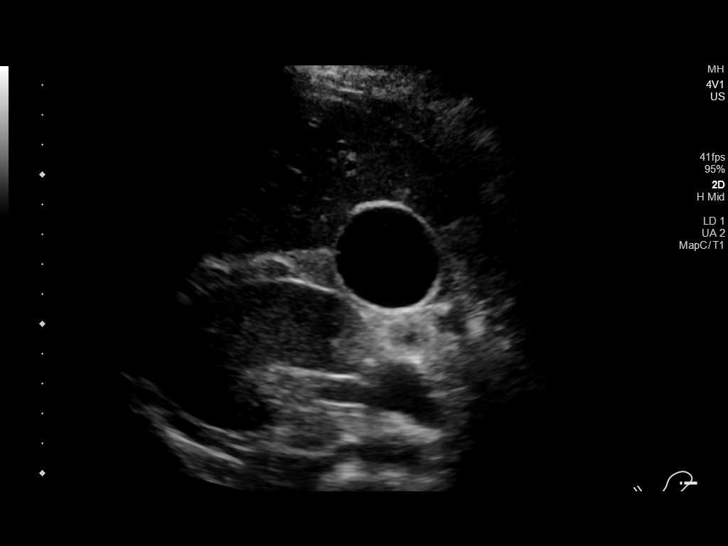
[im 21/123]
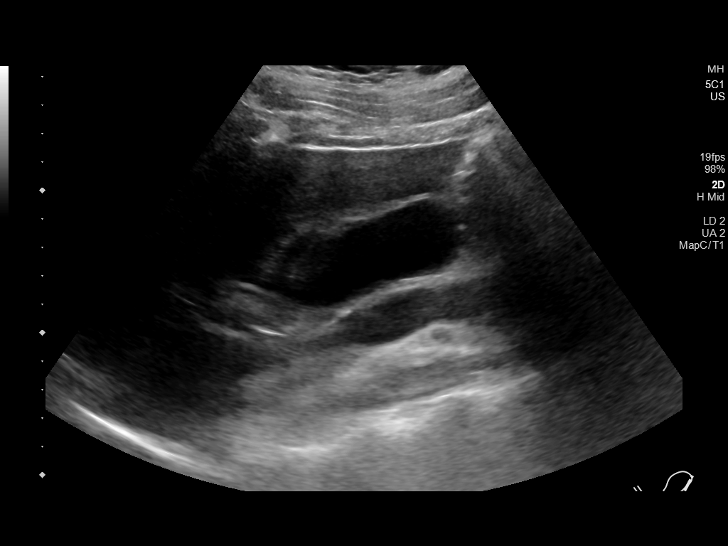
[im 31/123]
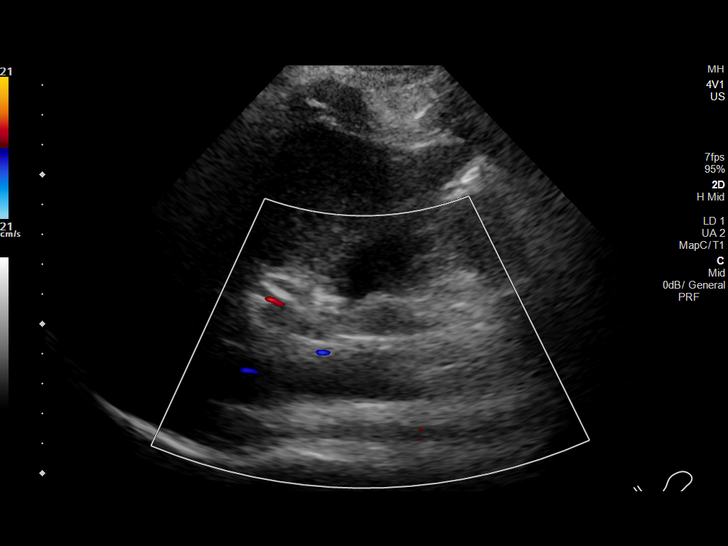
[im 41/123]
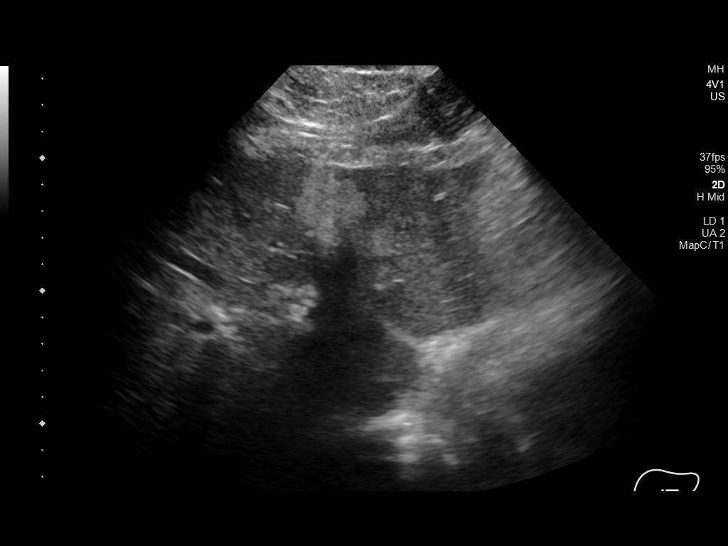
[im 51/123]
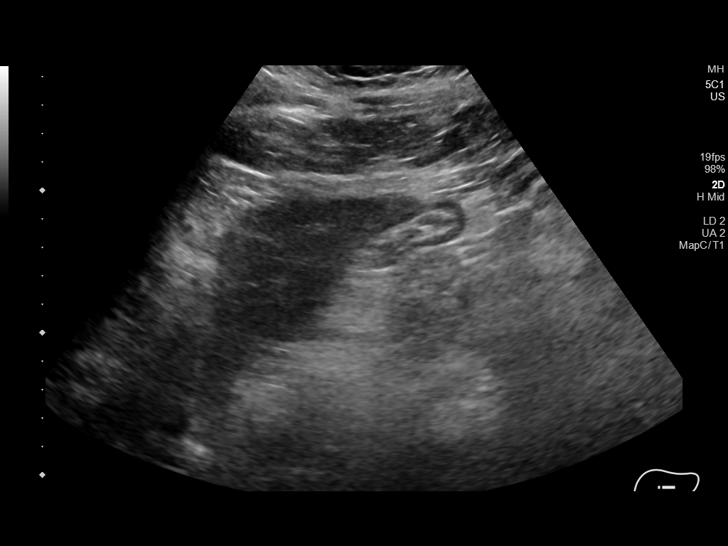
[im 62/123]
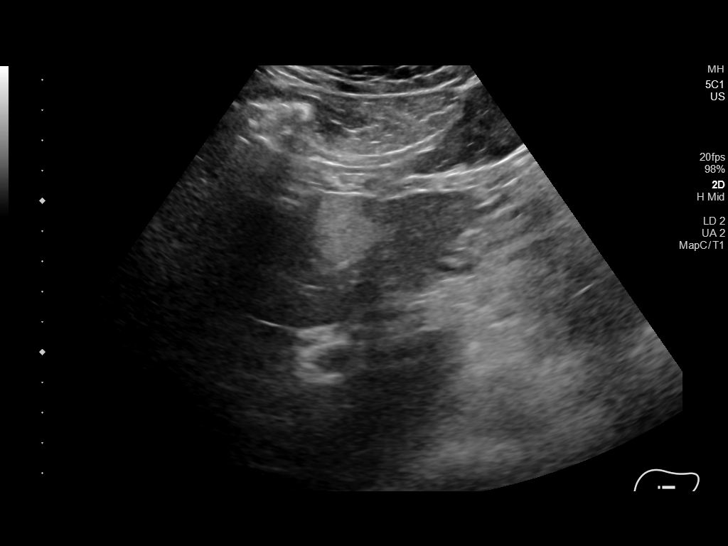
[im 72/123]
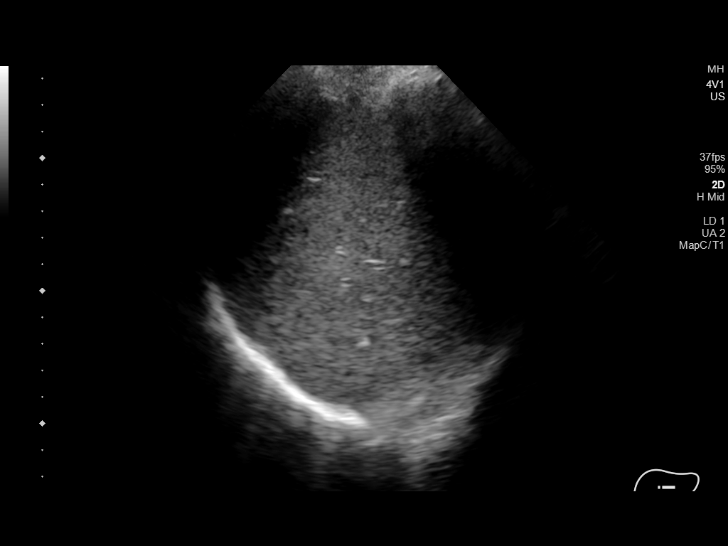
[im 82/123]
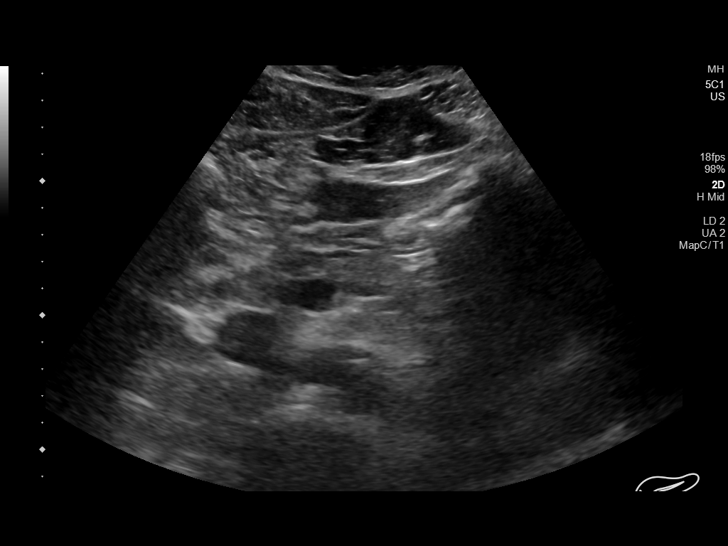
[im 92/123]
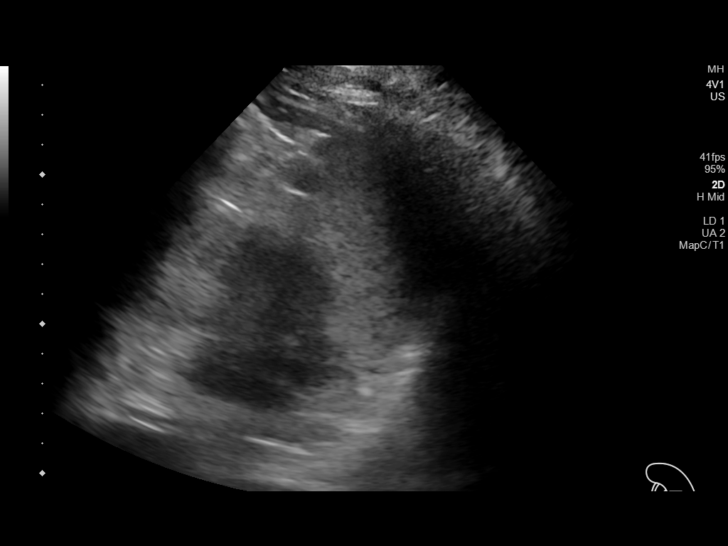
[im 102/123]
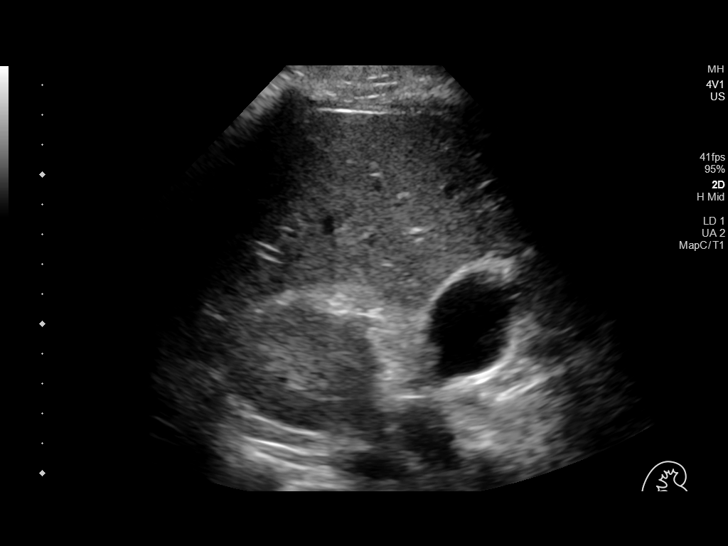
[im 112/123]
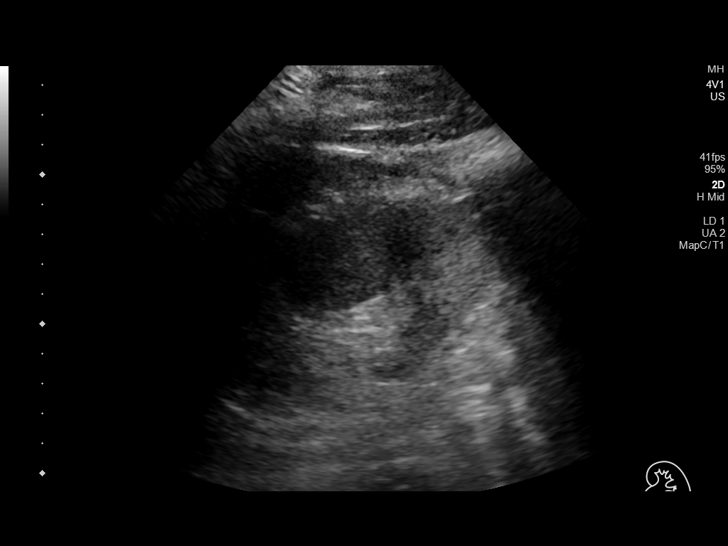
[im 123/123]
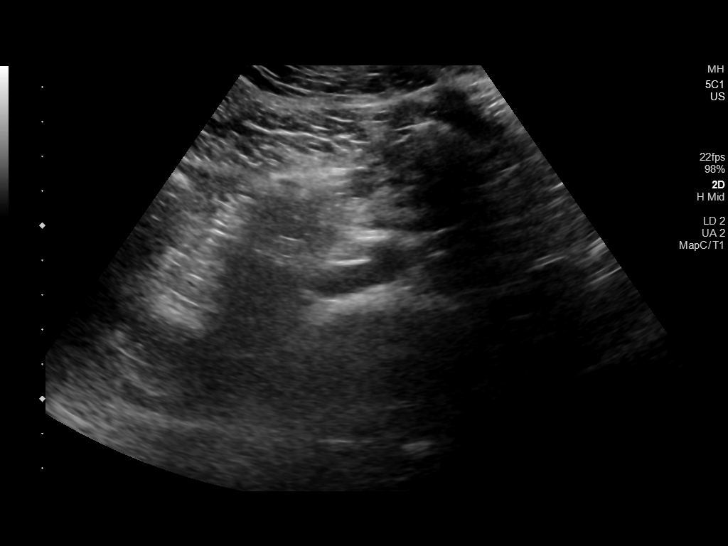

[13 of 25 positions shown; findings below may reference images not displayed]

FINDINGS: Gallbladder: No gallstones or wall thickening visualized (2.0 mm).
No sonographic Murphy sign noted by sonographer.

Common bile duct: Diameter: 4.2 mm

Liver: A 4.7 cm x 2.3 cm x 2.4 cm area of heterogeneous increased
echogenicity is seen within the right lobe of the liver. Within
normal limits in parenchymal echogenicity. Portal vein is patent on
color Doppler imaging with normal direction of blood flow towards
the liver.

IVC: No abnormality visualized.

Pancreas: Visualized portion unremarkable.

Spleen: Size (5.8 cm) and appearance within normal limits.

Right Kidney: Length: 12.1 cm. Echogenicity within normal limits. No
mass or hydronephrosis visualized.

Left Kidney: Length: 11.7 cm. Echogenicity within normal limits. No
mass or hydronephrosis visualized.

Abdominal aorta: No aneurysm visualized.

Other findings: None.
IMPRESSION: 1. Area of heterogeneous increased echogenicity within the right
lobe of the liver which may represent an area of focal fatty
infiltration.
2. No evidence of cholelithiasis or acute cholecystitis.

## 2021-10-12 ENCOUNTER — Ambulatory Visit
Admission: EM | Admit: 2021-10-12 | Discharge: 2021-10-12 | Disposition: A | Discharge status: Home / Self Care | Payer: Commercial Managed Care - PPO | Attending: Internal Medicine | Admitting: Internal Medicine

## 2021-10-12 ENCOUNTER — Other Ambulatory Visit: Payer: Self-pay

## 2021-10-12 DIAGNOSIS — L0501 Pilonidal cyst with abscess: Secondary | ICD-10-CM | POA: Diagnosis not present

## 2021-10-12 MED ORDER — DOXYCYCLINE HYCLATE 100 MG PO CAPS: 100.0000 mg | ORAL_CAPSULE | Freq: Two times a day (BID) | ORAL | 0 refills | Status: DC

## 2021-10-12 NOTE — Discharge Instructions (Signed)
You have been prescribed antibiotics to treat abscess of your buttocks.  Please go to the hospital if no improvement in symptoms or if it worsens in the next 24 to 48 hours. ?

## 2021-10-12 NOTE — ED Triage Notes (Signed)
Pt c/o abscess to tailbones. States this has been happening for over 5 years. States last night he "bumped" it which cause an odorous drainage. This most recent episode was first noticed ~ 3 days ago.  ?

## 2021-10-12 NOTE — ED Provider Notes (Signed)
?[facility] ? ? ? ?CSN: [id number] ?Arrival date & time: 10/12/21  1505 ? ? ?  ? ?History   ?Chief Complaint ?Chief Complaint  ?Patient presents with  ? Abscess  ? ? ?HPI ?Andre Gibbs is a 36 y.o. male.  ? ?Patient presents with abscess to tailbone that has been present over the past few days.  Patient reports that he noticed that it started draining purulent drainage yesterday.  Denies fevers, body aches, chills.  Patient reports history of pilonidal abscess that has been intermittent over the past 5 years.  He reports that he has seen successful treatment with only antibiotics and has had to have it drained a few times as well. ? ? ?Abscess ? ?Past Medical History:  ?Diagnosis Date  ? Allergy   ? Ulcerative colitis (HCC)   ? ? ?Patient Active Problem List  ? Diagnosis Date Noted  ? Ulcerative colitis (HCC) 12/02/2019  ? Drug abuse (HCC) 12/02/2019  ? Cannabinoid hyperemesis syndrome 12/02/2019  ? Intractable nausea and vomiting 11/19/2019  ? Colitis 11/19/2019  ? Hypokalemia 11/19/2019  ? Tobacco abuse 11/19/2019  ? Penile discharge 03/29/2017  ? Foreskin problem 03/29/2017  ? Screen for STD (sexually transmitted disease) 03/29/2017  ? Anxiety 03/29/2017  ? Need for influenza vaccination 03/29/2017  ? Chronic diarrhea 03/29/2017  ? Encounter for health maintenance examination in adult 03/29/2017  ? ? ?History reviewed. No pertinent surgical history. ? ? ? ? ?Home Medications   ? ?Prior to Admission medications   ?Medication Sig Start Date End Date Taking? Authorizing Provider  ?doxycycline (VIBRAMYCIN) 100 MG capsule Take 1 capsule (100 mg total) by mouth 2 (two) times daily. 10/12/21  Yes Gustavus Bryant, FNP  ?acetaminophen (TYLENOL) 500 MG tablet Take 500 mg by mouth every 6 (six) hours as needed for moderate pain.    [provider]  ?omeprazole (PRILOSEC) 20 MG capsule Take 1 capsule (20 mg total) by mouth daily. 12/05/19 12/04/20  Lanae Boast, MD  ?ondansetron (ZOFRAN) 4 MG tablet Take 1  tablet (4 mg total) by mouth every 8 (eight) hours as needed for up to 30 doses for nausea or vomiting. 12/05/19   Lanae Boast, MD  ?promethazine (PHENERGAN) 25 MG tablet Take 1 tablet (25 mg total) by mouth every 8 (eight) hours as needed for nausea or vomiting. 11/29/19   Maxwell Caul, PA-C  ? ? ?Family History ?Family History  ?Problem Relation Age of Onset  ? Stroke Mother   ?     x3  ? Hypertension Mother   ? Other Father   ?     history unknown  ? Hypertension Maternal Grandmother   ? Heart disease Maternal Grandmother   ? Diabetes Maternal Grandmother   ? Cancer Neg Hx   ? ? ?Social History ?Social History  ? ?Tobacco Use  ? Smoking status: Some Days  ?  Packs/day: 10.00  ?  Types: Cigarettes  ?  Last attempt to quit: 03/01/2013  ?  Years since quitting: 8.6  ? Smokeless tobacco: Never  ?Substance Use Topics  ? Alcohol use: Yes  ?  Alcohol/week: 2.0 standard drinks  ?  Types: 1 Cans of beer, 1 Shots of liquor per week  ? Drug use: No  ? ? ? ?Allergies   ?Patient has no known allergies. ? ? ?Review of Systems ?Review of Systems ?Per HPI ? ?Physical Exam ?Triage Vital Signs ?ED Triage Vitals  ?Enc Vitals Group  ?   BP 10/12/21 1520  127/80  ?   Pulse Rate 10/12/21 1519 71  ?   Resp 10/12/21 1519 18  ?   Temp 10/12/21 1519 98.3 ?F (36.8 ?C)  ?   Temp Source 10/12/21 1519 Oral  ?   SpO2 10/12/21 1519 96 %  ?   Weight --   ?   Height --   ?   Head Circumference --   ?   Peak Flow --   ?   Pain Score 10/12/21 1519 0  ?   Pain Loc --   ?   Pain Edu? --   ?   Excl. in GC? --   ? ?No data found. ? ?Updated Vital Signs ?BP 127/80 (BP Location: Right Arm)   Pulse 71   Temp 98.3 ?F (36.8 ?C) (Oral)   Resp 18   SpO2 96%  ? ?Visual Acuity ?Right Eye Distance:   ?Left Eye Distance:   ?Bilateral Distance:   ? ?Right Eye Near:   ?Left Eye Near:    ?Bilateral Near:    ? ?Physical Exam ?Exam conducted with a chaperone present.  ?Constitutional:   ?   General: He is not in acute distress. ?   Appearance: Normal appearance.  He is not toxic-appearing or diaphoretic.  ?HENT:  ?   Head: Normocephalic and atraumatic.  ?Eyes:  ?   Extraocular Movements: Extraocular movements intact.  ?   Conjunctiva/sclera: Conjunctivae normal.  ?Pulmonary:  ?   Effort: Pulmonary effort is normal.  ?Skin: ? ?    ?   Comments: Approximately 1 to 2 inch x 1 inch indurated pilonidal abscess present to right buttocks that is currently draining purulent drainage.   ?Neurological:  ?   General: No focal deficit present.  ?   Mental Status: He is alert and oriented to person, place, and time. Mental status is at baseline.  ?Psychiatric:     ?   Mood and Affect: Mood normal.     ?   Behavior: Behavior normal.     ?   Thought Content: Thought content normal.     ?   Judgment: Judgment normal.  ? ? ? ?UC Treatments / Results  ?Labs ?(all labs ordered are listed, but only abnormal results are displayed) ?Labs Reviewed - No data to display ? ?EKG ? ? ?Radiology ?No results found. ? ?Procedures ?Procedures (including critical care time) ? ?Medications Ordered in UC ?Medications - No data to display ? ?Initial Impression / Assessment and Plan / UC Course  ?I have reviewed the triage vital signs and the nursing notes. ? ?Pertinent labs & imaging results that were available during my care of the patient were reviewed by me and considered in my medical decision making (see chart for details). ? ?  ? ?Will prescribe doxycycline antibiotic to treat pilonidal abscess. Do not think that abscess is conducive to drainage at this time due to physical exam. It has also been draining on its on already per patient. Patient advised to use warm compresses as well. Patient advised of strict return and ER precautions and advised to return if symptoms do not improve or if they worsen over the next 24-48 hours. Patient verbalized understanding and was agreeable with plan.  ?Final Clinical Impressions(s) / UC Diagnoses  ? ?Final diagnoses:  ?Pilonidal abscess  ? ? ? ?Discharge Instructions    ? ?  ?You have been prescribed antibiotics to treat abscess of your buttocks.  Please go to the hospital if no improvement  in symptoms or if it worsens in the next 24 to 48 hours. ? ? ? ?ED Prescriptions   ? ? Medication Sig Dispense Auth. Provider  ? doxycycline (VIBRAMYCIN) 100 MG capsule Take 1 capsule (100 mg total) by mouth 2 (two) times daily. 20 capsule Ervin KnackMound, Cole Klugh E, [place]  ? ?  ? ?PDMP not reviewed this encounter. ?  ?Gustavus BryantMound, Amadu Schlageter E, [place]NP ?10/12/21 1547 ? ?

## 2023-04-30 ENCOUNTER — Encounter (HOSPITAL_COMMUNITY): Payer: Self-pay

## 2023-04-30 ENCOUNTER — Other Ambulatory Visit: Payer: Self-pay

## 2023-04-30 ENCOUNTER — Emergency Department (HOSPITAL_COMMUNITY)
Admission: EM | Admit: 2023-04-30 | Discharge: 2023-04-30 | Disposition: A | Discharge status: Home / Self Care | Payer: Commercial Managed Care - PPO | Attending: Emergency Medicine | Admitting: Emergency Medicine

## 2023-04-30 DIAGNOSIS — H60501 Unspecified acute noninfective otitis externa, right ear: Secondary | ICD-10-CM

## 2023-04-30 DIAGNOSIS — H6091 Unspecified otitis externa, right ear: Secondary | ICD-10-CM | POA: Diagnosis not present

## 2023-04-30 DIAGNOSIS — L0231 Cutaneous abscess of buttock: Secondary | ICD-10-CM | POA: Insufficient documentation

## 2023-04-30 HISTORY — DX: Essential (primary) hypertension: I10

## 2023-04-30 MED ORDER — CIPROFLOXACIN-DEXAMETHASONE 0.3-0.1 % OT SUSP: 4.0000 [drp] | Freq: Two times a day (BID) | OTIC | 0 refills | Status: AC

## 2023-04-30 MED ORDER — DOXYCYCLINE HYCLATE 100 MG PO CAPS: 100.0000 mg | ORAL_CAPSULE | Freq: Two times a day (BID) | ORAL | 0 refills | Status: AC

## 2023-04-30 MED ORDER — LIDOCAINE-EPINEPHRINE (PF) 2 %-1:200000 IJ SOLN
10.0000 mL | Freq: Once | INTRAMUSCULAR | Status: AC
  Administered 2023-04-30: 10 mL
  Filled 2023-04-30: qty 20

## 2023-04-30 NOTE — ED Triage Notes (Signed)
Patient has had an abscess above his buttocks for 4 days. Stated he has to have them drained. Has had them on and off for years.

## 2023-04-30 NOTE — Discharge Instructions (Addendum)
Please take the entire course of antibiotics that are prescribed.  If you are not taking the same antibiotics that have prescribed for the ear I would discontinue what ever you are prescribed online and start taking I prescribed.  You can follow-up with a general surgeon above to see if there is any additional treatment to remove an abscess pocket from the buttocks.  He continued to have buttocks pain, fever, chills despite treatment as above recommend return to the emergency department for further evaluation.

## 2023-04-30 NOTE — ED Provider Notes (Signed)
[facility] AT [facility] Provider Note   CSN: [id number] Arrival date & time: 04/30/23  4098     History  Chief Complaint  Patient presents with   Abscess    Andre Gibbs is a 37 y.o. male With overall noncontributory past medical history who presents with 2 complaints today.  Patient reports that he has an abscess above his buttocks for the last 4 days.  He reports he has had them previously and needed to have been drained before.  No recent antibiotics.  No history of hidradenitis suppurativa.  Patient also endorses a left ear infection diagnosed on telemedicine for which she has been taking eardrops for 2 days.  He would like [place] to take a look to ensure that this is in fact the correct diagnosis.  He denies any recent swimming, denies any drainage from ear, denies any hearing loss.  He reports he is using some kind of drops but is not sure what kind.   Abscess      Home Medications Prior to Admission medications   Medication Sig Start Date End Date Taking? Authorizing Provider  ciprofloxacin-dexamethasone (CIPRODEX) OTIC suspension Place 4 drops into the left ear 2 (two) times daily. For one week 04/30/23  Yes Devorah Givhan H, PA-C  doxycycline (VIBRAMYCIN) 100 MG capsule Take 1 capsule (100 mg total) by mouth 2 (two) times daily. 04/30/23  Yes Lonya Johannesen H, PA-C  acetaminophen (TYLENOL) 500 MG tablet Take 500 mg by mouth every 6 (six) hours as needed for moderate pain.    [provider]  omeprazole (PRILOSEC) 20 MG capsule Take 1 capsule (20 mg total) by mouth daily. 12/05/19 12/04/20  Lanae Boast, MD  ondansetron (ZOFRAN) 4 MG tablet Take 1 tablet (4 mg total) by mouth every 8 (eight) hours as needed for up to 30 doses for nausea or vomiting. 12/05/19   Lanae Boast, MD  promethazine (PHENERGAN) 25 MG tablet Take 1 tablet (25 mg total) by mouth every 8 (eight) hours as needed for nausea or vomiting. 11/29/19   Maxwell Caul,  PA-C      Allergies    Patient has no known allergies.    Review of Systems   Review of Systems  All other systems reviewed and are negative.   Physical Exam Updated Vital Signs BP (!) 180/108   Pulse 99   Temp 99 F (37.2 C) (Oral)   Resp 18   Ht 6\' 4"  (1.93 m)   Wt 123.4 kg   SpO2 100%   BMI 33.11 kg/m  Physical Exam Vitals and nursing note reviewed.  Constitutional:      General: He is not in acute distress.    Appearance: Normal appearance.  HENT:     Head: Normocephalic and atraumatic.     Ears:     Comments: Left ear canal with redness, greenish exudate. TM unremarkable. No drainage throughout Eyes:     General:        Right eye: No discharge.        Left eye: No discharge.  Cardiovascular:     Rate and Rhythm: Normal rate and regular rhythm.  Pulmonary:     Effort: Pulmonary effort is normal. No respiratory distress.  Musculoskeletal:        General: No deformity.  Skin:    General: Skin is warm and dry.     Comments: Patient with indurated tissue overlying the superior vertex of the gluteal cleft on the right.  There is a approximately 2 cm skin pustule with 3 to 4 cm of surrounding induration and fluctuance.  Neurological:     Mental Status: He is alert and oriented to person, place, and time.  Psychiatric:        Mood and Affect: Mood normal.        Behavior: Behavior normal.     ED Results / Procedures / Treatments   Labs (all labs ordered are listed, but only abnormal results are displayed) Labs Reviewed - No data to display  EKG None  Radiology No results found.  Procedures .Marland KitchenIncision and Drainage  Date/Time: 04/30/2023 11:42 AM  Performed by: Olene Floss, PA-C Authorized by: Olene Floss, PA-C   Consent:    Consent obtained:  Verbal   Consent given by:  Patient   Risks, benefits, and alternatives were discussed: yes     Risks discussed:  Bleeding, incomplete drainage, pain, infection and damage to other  organs   Alternatives discussed:  No treatment Universal protocol:    Procedure explained and questions answered to patient or proxy's satisfaction: yes     Patient identity confirmed:  Verbally with patient Location:    Type:  Abscess   Size:  3cm   Location:  Anogenital   Anogenital location:  Gluteal cleft Pre-procedure details:    Skin preparation:  Povidone-iodine Sedation:    Sedation type:  None Anesthesia:    Anesthesia method:  Local infiltration   Local anesthetic:  Lidocaine 2% w/o epi Procedure type:    Complexity:  Simple Procedure details:    Ultrasound guidance: yes     Incision types:  Single straight   Incision depth:  Dermal   Wound management:  Probed and deloculated   Drainage:  Purulent and bloody   Drainage amount:  Scant   Wound treatment:  Wound left open   Packing materials:  None Post-procedure details:    Procedure completion:  Tolerated     Medications Ordered in ED Medications  lidocaine-EPINEPHrine (XYLOCAINE W/EPI) 2 %-1:200000 (PF) injection 10 mL (has no administration in time range)    ED Course/ Medical Decision Making/ A&P                                 Medical Decision Making Risk Prescription drug management.   This patient is a 37 y.o. male who presents to the ED for concern of abscess of buttock.   Differential diagnoses prior to evaluation: Pilonidal abscess versus cellulitis with pustule, additionally endorses some left ear pain, considered otitis externa, otitis media, mastoiditis, vs other  Past Medical History / Social History / Additional history: Chart reviewed. Pertinent results include: overall noncontributory  Physical Exam: Physical exam performed. The pertinent findings include: Left ear canal with redness, greenish exudate. TM unremarkable. No drainage throughout   Patient with indurated tissue overlying the superior vertex of the gluteal cleft on the right.  There is a approximately 2 cm skin pustule with  3 to 4 cm of surrounding induration and fluctuance.   Medications / Treatment: Incision and drainage as above. Will dc with doxycycline for cellulitis, abscess, and ciprodex for otitis externa   Disposition: After consideration of the diagnostic results and the patients response to treatment, I feel that patient is stable for discharge with plan as above, encourage close follow-up, he requested general surgery follow-up in case abscess needs to be excised in the future, I think this  is reasonable.   emergency department workup does not suggest an emergent condition requiring admission or immediate intervention beyond what has been performed at this time. The plan is: as above. The patient is safe for discharge and has been instructed to return immediately for worsening symptoms, change in symptoms or any other concerns.  Final Clinical Impression(s) / ED Diagnoses Final diagnoses:  Abscess of buttock, right  Acute otitis externa of right ear, unspecified type    Rx / DC Orders ED Discharge Orders          Ordered    doxycycline (VIBRAMYCIN) 100 MG capsule  2 times daily        04/30/23 1145    ciprofloxacin-dexamethasone (CIPRODEX) OTIC suspension  2 times daily        04/30/23 1145              Adalene Gulotta, [place], PA-C 04/30/23 1153    Bethann Berkshire, MD 05/03/23 1700

## 2023-12-13 ENCOUNTER — Ambulatory Visit
Admission: EM | Admit: 2023-12-13 | Discharge: 2023-12-13 | Disposition: A | Discharge status: Home / Self Care | Attending: Physician Assistant | Admitting: Physician Assistant

## 2023-12-13 DIAGNOSIS — S46912A Strain of unspecified muscle, fascia and tendon at shoulder and upper arm level, left arm, initial encounter: Secondary | ICD-10-CM | POA: Diagnosis not present

## 2023-12-13 MED ORDER — DICLOFENAC SODIUM 75 MG PO TBEC: 75.0000 mg | DELAYED_RELEASE_TABLET | Freq: Two times a day (BID) | ORAL | 0 refills | Status: AC

## 2023-12-13 NOTE — Discharge Instructions (Addendum)
 Return if any problems.

## 2023-12-13 NOTE — ED Provider Notes (Signed)
 [facility]    CSN: [id number] Arrival date & time: 12/13/23  1231      History   Chief Complaint Chief Complaint  Patient presents with   Arm Pain    HPI Andre Gibbs is a 38 y.o. male.   Patient complains of pain in his left shoulder.  Pain radiates from shoulder to elbow.  Patient has some soreness in the lower part of his neck.  Patient denies any injury.  He does do repetitive lifting.  Patient denies any chest pain.  He does not have any weakness or numbness.  Patient denies any neck pain he denies any back pain.  Patient has not had any chest pain or shortness of breath  The history is provided by the patient. No language interpreter was used.  Arm Pain    Past Medical History:  Diagnosis Date   Allergy    Hypertension    Ulcerative colitis [facility])     Patient Active Problem List   Diagnosis Date Noted   Ulcerative colitis (HCC) 12/02/2019   Drug abuse (HCC) 12/02/2019   Cannabinoid hyperemesis syndrome 12/02/2019   Intractable nausea and vomiting 11/19/2019   Colitis 11/19/2019   Hypokalemia 11/19/2019   Tobacco abuse 11/19/2019   Penile discharge 03/29/2017   Foreskin problem 03/29/2017   Screen for STD (sexually transmitted disease) 03/29/2017   Anxiety 03/29/2017   Need for influenza vaccination 03/29/2017   Chronic diarrhea 03/29/2017   Encounter for health maintenance examination in adult 03/29/2017    History reviewed. No pertinent surgical history.     Home Medications    Prior to Admission medications   Medication Sig Start Date End Date Taking? Authorizing Provider  acetaminophen  (TYLENOL ) 500 MG tablet Take 500 mg by mouth every 6 (six) hours as needed for moderate pain.   Yes [provider]  diclofenac (VOLTAREN) 75 MG EC tablet Take 1 tablet (75 mg total) by mouth 2 (two) times daily. 12/13/23  Yes Daimion Adamcik K, PA-C  ciprofloxacin -dexamethasone  (CIPRODEX ) OTIC suspension Place 4 drops into the left ear 2 (two)  times daily. For one week 04/30/23   Prosperi, Christian H, PA-C  doxycycline  (VIBRAMYCIN ) 100 MG capsule Take 1 capsule (100 mg total) by mouth 2 (two) times daily. 04/30/23   Prosperi, Christian H, PA-C  omeprazole  (PRILOSEC) 20 MG capsule Take 1 capsule (20 mg total) by mouth daily. 12/05/19 12/04/20  Lesa Rape, MD  ondansetron  (ZOFRAN ) 4 MG tablet Take 1 tablet (4 mg total) by mouth every 8 (eight) hours as needed for up to 30 doses for nausea or vomiting. 12/05/19   Lesa Rape, MD  promethazine  (PHENERGAN ) 25 MG tablet Take 1 tablet (25 mg total) by mouth every 8 (eight) hours as needed for nausea or vomiting. 11/29/19   Valla Gauss, PA-C    Family History Family History  Problem Relation Age of Onset   Stroke Mother        x3   Hypertension Mother    Other Father        history unknown   Hypertension Maternal Grandmother    Heart disease Maternal Grandmother    Diabetes Maternal Grandmother    Cancer Neg Hx     Social History Social History   Tobacco Use   Smoking status: Some Days    Current packs/day: 0.00    Types: Cigarettes    Last attempt to quit: 03/01/2013    Years since quitting: 10.7   Smokeless tobacco: Never  Vaping  Use   Vaping status: Never Used  Substance Use Topics   Alcohol use: Yes    Alcohol/week: 2.0 standard drinks of alcohol    Types: 1 Cans of beer, 1 Shots of liquor per week    Comment: Occassionally.   Drug use: No     Allergies   Patient has no known allergies.   Review of Systems Review of Systems  All other systems reviewed and are negative.    Physical Exam Triage Vital Signs ED Triage Vitals  Encounter Vitals Group     BP 12/13/23 1255 131/82     Systolic BP Percentile --      Diastolic BP Percentile --      Pulse Rate 12/13/23 1255 73     Resp 12/13/23 1255 18     Temp 12/13/23 1255 98.5 F (36.9 C)     Temp Source 12/13/23 1255 Oral     SpO2 12/13/23 1255 97 %     Weight 12/13/23 1253 268 lb (121.6 kg)      Height 12/13/23 1253 6\' 4"  (1.93 m)     Head Circumference --      Peak Flow --      Pain Score 12/13/23 1252 7     Pain Loc --      Pain Education --      Exclude from Growth Chart --    No data found.  Updated Vital Signs BP 131/82 (BP Location: Left Arm)   Pulse 73   Temp 98.5 F (36.9 C) (Oral)   Resp 18   Ht 6\' 4"  (1.93 m)   Wt 121.6 kg   SpO2 97%   BMI 32.62 kg/m   Visual Acuity Right Eye Distance:   Left Eye Distance:   Bilateral Distance:    Right Eye Near:   Left Eye Near:    Bilateral Near:     Physical Exam Vitals reviewed.  Constitutional:      Appearance: Normal appearance.  HENT:     Nose: Nose normal.  Cardiovascular:     Rate and Rhythm: Normal rate.  Pulmonary:     Effort: Pulmonary effort is normal.  Abdominal:     General: Abdomen is flat.  Musculoskeletal:        General: No swelling or tenderness.     Cervical back: Normal range of motion.     Comments: Tender right trapezius tender full shoulder pain with AB adduction.  Neurovascular neurosensory intact  Skin:    General: Skin is warm.  Neurological:     General: No focal deficit present.     Mental Status: He is alert.  Psychiatric:        Mood and Affect: Mood normal.      UC Treatments / Results  Labs (all labs ordered are listed, but only abnormal results are displayed) Labs Reviewed - No data to display  EKG   Radiology No results found.  Procedures Procedures (including critical care time)  Medications Ordered in UC Medications - No data to display  Initial Impression / Assessment and Plan / UC Course  I have reviewed the triage vital signs and the nursing notes.  Pertinent labs & imaging results that were available during my care of the patient were reviewed by me and considered in my medical decision making (see chart for details).    Light duty for 1 week.  No heavy lifting   Final Clinical Impressions(s) / UC Diagnoses   Final diagnoses:  Strain  of  left shoulder, initial encounter   Discharge Instructions      Return if any problems.   ED Prescriptions     Medication Sig Dispense Auth. Provider   diclofenac (VOLTAREN) 75 MG EC tablet Take 1 tablet (75 mg total) by mouth 2 (two) times daily. 20 tablet Juhi Lagrange K, PA-C      PDMP not reviewed this encounter.   Sandi Crosby, [place] 12/13/23 1333

## 2023-12-13 NOTE — ED Triage Notes (Signed)
"  My left arm is hurting me often in the elbow, shoulder especially when elevated and lifting". 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

## 2023-12-21 ENCOUNTER — Ambulatory Visit
Admission: RE | Admit: 2023-12-21 | Discharge: 2023-12-21 | Disposition: A | Discharge status: Home / Self Care | Source: Ambulatory Visit

## 2023-12-21 ENCOUNTER — Ambulatory Visit
Admission: RE | Admit: 2023-12-21 | Discharge: 2023-12-21 | Discharge status: Against Medical Advice | Source: Ambulatory Visit | Attending: Family Medicine | Admitting: Family Medicine

## 2023-12-21 DIAGNOSIS — Z5329 Procedure and treatment not carried out because of patient's decision for other reasons: Secondary | ICD-10-CM

## 2023-12-21 NOTE — ED Triage Notes (Signed)
 Patient came back by UC for note correction/addendum. Provided after discussion with Dr. Ellsworth Haas.  Maude Sorrel CMA

## 2023-12-21 NOTE — ED Triage Notes (Signed)
 A user error has taken place: encounter opened in error, closed for administrative reasons.   See 12-13-2023 for Letter/Information.
